# Patient Record
Sex: Male | Born: 1935
Health system: Southern US, Community
[De-identification: ages and names within clinical notes are randomized; demographics above are authoritative.]

## PROBLEM LIST (undated history)

## (undated) DIAGNOSIS — C61 Malignant neoplasm of prostate: Secondary | ICD-10-CM

## (undated) DIAGNOSIS — F32A Depression, unspecified: Secondary | ICD-10-CM

## (undated) DIAGNOSIS — M1990 Unspecified osteoarthritis, unspecified site: Secondary | ICD-10-CM

## (undated) DIAGNOSIS — F419 Anxiety disorder, unspecified: Secondary | ICD-10-CM

## (undated) DIAGNOSIS — R011 Cardiac murmur, unspecified: Secondary | ICD-10-CM

## (undated) DIAGNOSIS — C801 Malignant (primary) neoplasm, unspecified: Secondary | ICD-10-CM

## (undated) DIAGNOSIS — J189 Pneumonia, unspecified organism: Secondary | ICD-10-CM

## (undated) HISTORY — DX: Malignant (primary) neoplasm, unspecified: C80.1

## (undated) HISTORY — PX: WRIST SURGERY: SHX841

## (undated) HISTORY — DX: Unspecified osteoarthritis, unspecified site: M19.90

## (undated) HISTORY — DX: Cardiac murmur, unspecified: R01.1

## (undated) HISTORY — PX: OTHER SURGICAL HISTORY: SHX169

## (undated) HISTORY — PX: CHOLECYSTECTOMY: SHX55

---
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
## 1993-04-07 HISTORY — PX: MENISCUS REPAIR: SHX5179

## 2003-04-08 HISTORY — PX: INSERTION PROSTATE RADIATION SEED: SUR718

## 2003-08-15 ENCOUNTER — Ambulatory Visit: Admission: RE | Admit: 2003-08-15 | Discharge: 2003-09-11 | Payer: Self-pay | Admitting: Radiation Oncology

## 2003-11-03 ENCOUNTER — Ambulatory Visit: Admission: RE | Admit: 2003-11-03 | Discharge: 2004-02-01 | Payer: Self-pay | Admitting: Radiation Oncology

## 2004-02-08 ENCOUNTER — Ambulatory Visit: Admission: RE | Admit: 2004-02-08 | Discharge: 2004-02-08 | Payer: Self-pay | Admitting: Radiation Oncology

## 2004-06-06 ENCOUNTER — Observation Stay (HOSPITAL_COMMUNITY): Admission: RE | Admit: 2004-06-06 | Discharge: 2004-06-07 | Payer: Self-pay | Admitting: Surgery

## 2004-06-06 ENCOUNTER — Encounter (INDEPENDENT_AMBULATORY_CARE_PROVIDER_SITE_OTHER): Payer: Self-pay | Admitting: Specialist

## 2010-03-26 ENCOUNTER — Encounter
Admission: RE | Admit: 2010-03-26 | Discharge: 2010-03-26 | Payer: Self-pay | Source: Home / Self Care | Attending: Orthopaedic Surgery | Admitting: Orthopaedic Surgery

## 2010-04-28 ENCOUNTER — Encounter: Payer: Self-pay | Admitting: Gastroenterology

## 2010-04-29 ENCOUNTER — Encounter: Payer: Self-pay | Admitting: Gastroenterology

## 2010-08-23 NOTE — Op Note (Signed)
NAMERODERIC, LAMMERT                ACCOUNT NO.:  [id number]   MEDICAL RECORD NO.:  [id number]          PATIENT TYPE:  AMB   LOCATION:  DAY                          FACILITY:  [facility]   PHYSICIAN:  Currie Paris, M.D.DATE OF BIRTH:  [birth year]   DATE OF PROCEDURE:  06/06/2004  DATE OF DISCHARGE:                                 OPERATIVE REPORT   CCS NUMBER:  [id number].   PREOPERATIVE DIAGNOSIS:  Gallstones.   POSTOPERATIVE DIAGNOSIS:  Gallstones with possible common bile duct  stricture on intraoperative cholangiogram.   OPERATION:  Laparoscopic cholecystectomy with intraoperative cholangiogram.   SURGEON:  Currie Paris, M.D.   ANESTHESIA:  General.   CLINICAL HISTORY:  This patient is a 77 year old man noted to have  gallstones.  He began having some biliary-type symptoms.  Preoperative liver  functions were normal.   DESCRIPTION OF PROCEDURE:  Patient was seen in the holding area, and he had  no further questions.  He was taken to the operating room and after  satisfactory general endotracheal anesthesia had been obtained, the abdomen  was prepped and draped.  A time-out occurred.   I used a 0.25% plain Marcaine for each incision.  The umbilical incision was  made, and the fascia opened.  The peritoneal cavity entered under direct  vision.  A purse-string was placed, the Hasson introduced, and the abdomen  insufflated to 15.  The abdomen looked basically normal, when the camera was  placed.  The patient was placed in reverse Trendelenburg, and the 10-11  trocar placed in the epigastrium and the two 5's laterally.   With the gallbladder retracted over the liver, I opened the peritoneum and  the triangle of Calot, and identified both the cystic duct, which was fairly  long, and the cystic artery, and opened the window behind that, so I had  clear visualization of the tissues and the anatomy.   I put a single clip on the cystic artery and one on the cystic duct  at its  junction with the gallbladder.   The cystic duct was opened, and a Cook catheter introduced percutaneously.  I threaded into the cystic duct, and operative cholangiography done.  On the  first run, we saw dye in the duodenum, but it was very slow to enter and the  duct was somewhat plump.  I waited a few minutes and injected a little bit  more, and then we did a couple of more injections after giving some Glucagon  to try to dilate the distal duct and relax the sphincter.  I was concerned  there might be a small stricture at the distal duct, but there is no  evidence of stones, and the hepatic ducts all appear to be normal.  At this  point, the cystic duct catheter was removed, and three clips placed on the  stay side of the cystic duct, and it was divided.  The cystic artery was  dissected out a little further to make sure we had it well around, and clips  placed on it to divide it.   The gallbladder was  removed from below to above, and about a third of the  way up, there were some small vessels coming into the back of the  gallbladder directly out of the liver, which were clipped and divided.  Once  the gallbladder was removed, it was placed in a bag.  We spent several  minutes irrigating to make sure everything was dry, cauterizing the bed of  the liver.  I put a little piece of Surgicel in where the liver bed was a  little bit raw, and it remained dry while we were finishing the case and was  checked just prior to the completion of the case.   The camera was placed in the epigastric port, and the gallbladder removed at  the supraumbilical port.  We did a final irrigation check for hemostasis.  As noted, everything appeared to be dry.  The lateral ports were removed,  and there was no bleeding.  The purse string was used to close the umbilical  port.  The abdomen was deflated through the epigastric port.  Skin was  closed with 4-0 Monocryl and subcuticular with Dermabond.    Patient tolerated the procedure well.  There were no operative  complications.  All counts were correct.      CJS/MEDQ  D:  06/06/2004  T:  06/06/2004  Job:  [id number]   cc:   Gloriajean Dell. Andrey Campanile, M.D.  [id number]  [place]  [place] [address]  Fax: [phone]

## 2012-01-20 ENCOUNTER — Ambulatory Visit (INDEPENDENT_AMBULATORY_CARE_PROVIDER_SITE_OTHER): Payer: Medicare Other | Admitting: General Surgery

## 2012-01-20 ENCOUNTER — Encounter (INDEPENDENT_AMBULATORY_CARE_PROVIDER_SITE_OTHER): Payer: Self-pay | Admitting: General Surgery

## 2012-01-20 VITALS — BP 148/78 | HR 76 | Temp 97.7°F | Resp 16 | Ht 72.0 in | Wt 176.4 lb

## 2012-01-20 DIAGNOSIS — K4090 Unilateral inguinal hernia, without obstruction or gangrene, not specified as recurrent: Secondary | ICD-10-CM

## 2012-01-20 NOTE — Progress Notes (Signed)
Patient ID: Lee Oconnell, male   DOB: 1933/05/15, 78 y.o.   MRN: [id number]  Chief Complaint  Patient presents with  . Inguinal Hernia    new pt- eval LIH    HPI Lee Oconnell is a 78 y.o. male.  Self referred HPI This is a 78 year old male who has some anxiety. He has been treated for prostate cancer with radiation after having 2 TURPs in the past as well. He has a prior history of a right inguinal hernia repair at age 42 for which he had significant postoperative nausea and vomiting and had a very bad experience. He since has undergone a cholecystectomy for which he did very well though. He has some occasional right-sided abdominal pains with no notice of any bulge. He has no changes in his bowel movements and he states he's had a colonoscopy about 10 years ago that was fine. He has some occasional twinges in his right groin. This has been present for a long time. He also complains of some pain in his left groin as well as some tenderness when he coughs and a sensation that he feels something in that area. He describes this as an impulse. There is constantly a pinching an aching sensation. This comes and goes in terms of its severity. He does not related to any exercise or any activity except for coughing. He was diagnosed with a left groin hernia was then referred for evaluation.  Past Medical History  Diagnosis Date  . Arthritis   . Cancer   . Heart murmur     Past Surgical History  Procedure Date  . Appendectomy 1951  . Hernia repair 1951  . Tonsilectomy, adenoidectomy, bilateral myringotomy and tubes 1957  . Meniscus repair 1995  . Trans-urethral resection of prostate 1999, 2003  . Cholecystectomy     2006  . Insertion prostate radiation seed 2005    Family History  Problem Relation Age of Onset  . Heart disease Mother   . Stroke Father   . Cancer Paternal Grandfather     leukemia    Social History History  Substance Use Topics  . Smoking status: Never Smoker   .  Smokeless tobacco: Not on file  . Alcohol Use: Yes    No Known Allergies  Current Outpatient Prescriptions  Medication Sig Dispense Refill  . ALPRAZolam (XANAX) 0.25 MG tablet Take 0.25 mg by mouth at bedtime as needed.      . calcium carbonate (OS-CAL) 600 MG TABS Take 600 mg by mouth 2 (two) times daily with a meal.      . Cholecalciferol (VITAMIN D-3 PO) Take by mouth.      . cyanocobalamin 1000 MCG tablet Take 100 mcg by mouth daily.      . fish oil-omega-3 fatty acids 1000 MG capsule Take 2 g by mouth daily.      . mometasone (ELOCON) 0.1 % cream       . vitamin C (ASCORBIC ACID) 250 MG tablet Take 250 mg by mouth daily.        Review of Systems Review of Systems  Constitutional: Negative for fever, chills and unexpected weight change.  HENT: Negative for hearing loss, congestion, sore throat, trouble swallowing and voice change.   Eyes: Negative for visual disturbance.  Respiratory: Negative for cough and wheezing.   Cardiovascular: Negative for chest pain, palpitations and leg swelling.  Gastrointestinal: Positive for abdominal pain. Negative for nausea, vomiting, diarrhea, constipation, blood in stool, abdominal distention, anal bleeding and rectal  pain.  Genitourinary: Negative for hematuria and difficulty urinating.  Musculoskeletal: Positive for arthralgias.  Skin: Positive for rash. Negative for wound.  Neurological: Negative for seizures, syncope, weakness and headaches.  Hematological: Negative for adenopathy. Does not bruise/bleed easily.  Psychiatric/Behavioral: Negative for confusion.    Blood pressure 148/78, pulse 76, temperature 97.7 F (36.5 C), temperature source Temporal, resp. rate 16, height 6' (1.829 m), weight 176 lb 6.4 oz (80.015 kg).  Physical Exam Physical Exam  Vitals reviewed. Constitutional: He appears well-developed and well-nourished.  Cardiovascular: Normal rate, regular rhythm and normal heart sounds.   Pulmonary/Chest: Effort normal and  breath sounds normal. He has no wheezes. He has no rales.  Abdominal: Soft. Normal appearance and bowel sounds are normal. There is tenderness. A hernia is present. Hernia confirmed positive in the left inguinal area. Hernia confirmed negative in the right inguinal area.      Data Reviewed Prior notes  Assessment    LIH    Plan    I'm not sure what his right sided abdominal pain is coming from. There is no real source that I can identify my exam today. We discussed a possible CT scan. He first is going to see his gastroenterologist Dr. Matthias Hughs and I will follow up with him after that.  He does have a small left groin hernia. I'm not entirely sure that all of his symptoms are coming from the small hernia but certainly merits consideration for repair. I discussed that this may not relieve all of his symptoms in his left groin.    We discussed observation versus repair.  We discussed an open inguinal hernia repair. I described the procedure in detail.  The patient was given educational material.  Goals should be achieved with surgery. We discussed the usage of mesh and the rationale behind that. We went over the pathophysiology of inguinal hernia. We have elected to perform open inguinal hernia repair with mesh.  We discussed the risks including bleeding, infection, recurrence, postoperative pain and chronic groin pain, testicular injury, urinary retention, numbness in groin and around incision.      Sharisa Toves 01/20/2012, 11:29 AM

## 2012-02-05 ENCOUNTER — Other Ambulatory Visit: Payer: Self-pay | Admitting: Gastroenterology

## 2012-02-05 DIAGNOSIS — R1032 Left lower quadrant pain: Secondary | ICD-10-CM

## 2012-02-09 ENCOUNTER — Ambulatory Visit
Admission: RE | Admit: 2012-02-09 | Discharge: 2012-02-09 | Disposition: A | Discharge status: Home / Self Care | Payer: Medicare Other | Source: Ambulatory Visit | Attending: Gastroenterology | Admitting: Gastroenterology

## 2012-02-09 DIAGNOSIS — R1032 Left lower quadrant pain: Secondary | ICD-10-CM

## 2012-03-15 ENCOUNTER — Encounter (INDEPENDENT_AMBULATORY_CARE_PROVIDER_SITE_OTHER): Payer: Self-pay | Admitting: General Surgery

## 2012-03-15 ENCOUNTER — Ambulatory Visit (INDEPENDENT_AMBULATORY_CARE_PROVIDER_SITE_OTHER): Payer: Medicare Other | Admitting: General Surgery

## 2012-03-15 VITALS — BP 120/80 | HR 88 | Resp 18 | Ht 72.0 in | Wt 179.2 lb

## 2012-03-15 DIAGNOSIS — K4090 Unilateral inguinal hernia, without obstruction or gangrene, not specified as recurrent: Secondary | ICD-10-CM

## 2012-03-15 NOTE — Progress Notes (Signed)
Patient ID: Lee Oconnell, male   DOB: 04-Jan-1934, 78 y.o.   MRN: [id number]  No chief complaint on file.   HPI Lee Oconnell is a 78 y.o. male.   HPI This is a 78 year old male who I saw recently for a left inguinal hernia. Please see his prior no first history. Since I seen him he is been seen by gastroenterology. He underwent a CT scan of his abdomen and pelvis for a variety of pains that she was really only the left groin hernia. He returns today with no significant changes. Comes in today to discuss a left inguinal hernia repair.  Past Medical History  Diagnosis Date  . Arthritis   . Cancer   . Heart murmur     Past Surgical History  Procedure Date  . Appendectomy 1951  . Hernia repair 1951  . Tonsilectomy, adenoidectomy, bilateral myringotomy and tubes 1957  . Meniscus repair 1995  . Trans-urethral resection of prostate 1999, 2003  . Cholecystectomy     2006  . Insertion prostate radiation seed 2005    Family History  Problem Relation Age of Onset  . Heart disease Mother   . Stroke Father   . Cancer Paternal Grandfather     leukemia    Social History History  Substance Use Topics  . Smoking status: Never Smoker   . Smokeless tobacco: Not on file  . Alcohol Use: Yes    No Known Allergies  Current Outpatient Prescriptions  Medication Sig Dispense Refill  . ALPRAZolam (XANAX) 0.25 MG tablet Take 0.25 mg by mouth at bedtime as needed.      . calcium carbonate (OS-CAL) 600 MG TABS Take 600 mg by mouth 2 (two) times daily with a meal.      . Cholecalciferol (VITAMIN D-3 PO) Take by mouth.      . cyanocobalamin 1000 MCG tablet Take 100 mcg by mouth daily.      . fish oil-omega-3 fatty acids 1000 MG capsule Take 2 g by mouth daily.      . mometasone (ELOCON) 0.1 % cream       . vitamin C (ASCORBIC ACID) 250 MG tablet Take 250 mg by mouth daily.        Review of Systems Review of Systems  There were no vitals taken for this visit.  Physical Exam  Physical  Exam  Vitals reviewed. Constitutional: He appears well-developed and well-nourished.  Cardiovascular: Normal rate, regular rhythm and normal heart sounds.   Pulmonary/Chest: Effort normal and breath sounds normal. He has no wheezes. He has no rales.  Abdominal: Soft. There is no tenderness. A hernia is present. Hernia confirmed positive in the left inguinal area.    Data Reviewed CT ABDOMEN AND PELVIS WITHOUT CONTRAST  Technique: Multidetector CT imaging of the abdomen and pelvis was  performed following the standard protocol without intravenous  contrast.  Comparison: CT scan dated 05/04/2006  Findings: The patient has a small left inguinal hernia containing  only fat. This is more prominent than on the prior exam but there  is only a tiny amount of fat in the inguinal canal which can be  seen as a normal finding.  Again noted is a prominent varix in the left lobe of the liver as  previously evaluated on 05/04/2006. Liver parenchyma is otherwise  normal. Gallbladder has been removed.  Spleen and pancreas and adrenal glands are normal. Multiple left  renal cysts have enlarged. Right kidney is normal.  The bowel appears normal. There  is a large posterior bladder  diverticulum, essentially unchanged. Prostate gland is enlarged  but diminished in size since the prior study. Multiple small  metallic clips are seen in the prostate gland.  The patient has severe facet arthritis in the lower lumbar spine  with grade 1 spondylolisthesis of L4 and L5. Both sacroiliac  joints are partially fused.  IMPRESSION:  1. Small left inguinal hernia containing only fat.  2. Extensive degenerative facet arthritis in the lower lumbar  spine.  3. Stable large bladder diverticulum.  4. Stable prominent varix in the left lobe of the liver.   Assessment    LIH    Plan    We discussed observation versus repair.  We discussed  open inguinal hernia repair. I described the procedure in detail.  The  patient was given educational material.  Goals should be achieved with surgery. We discussed the usage of mesh and the rationale behind that. We went over the pathophysiology of inguinal hernia. We have elected to perform open inguinal hernia repair with mesh.  We discussed the risks including bleeding, infection, recurrence, postoperative pain and chronic groin pain, testicular injury, urinary retention, numbness in groin and around incision.         Teruko Joswick 03/15/2012, 1:31 PM

## 2012-03-18 ENCOUNTER — Telehealth (INDEPENDENT_AMBULATORY_CARE_PROVIDER_SITE_OTHER): Payer: Self-pay | Admitting: General Surgery

## 2012-03-18 NOTE — Telephone Encounter (Signed)
Pt using topical NSAID for "knee problem."  Advised pt to stop using it on Saturday, for surgery on the following Thursday.  He understands and will comply.

## 2012-03-25 ENCOUNTER — Telehealth (INDEPENDENT_AMBULATORY_CARE_PROVIDER_SITE_OTHER): Payer: Self-pay

## 2012-03-25 DIAGNOSIS — K4090 Unilateral inguinal hernia, without obstruction or gangrene, not specified as recurrent: Secondary | ICD-10-CM

## 2012-03-25 NOTE — Telephone Encounter (Signed)
Pt called with questions about dosing of his pain medication - he is having a lot of pain.  He was advised to take 2 Percocet every 4-6 hours if needed the first 48 hours and not try to "wean" off for the next several days.

## 2012-03-26 ENCOUNTER — Telehealth (INDEPENDENT_AMBULATORY_CARE_PROVIDER_SITE_OTHER): Payer: Self-pay | Admitting: General Surgery

## 2012-03-26 NOTE — Telephone Encounter (Signed)
Pt called to report feeling really strange and ?halluncinations since taking the Roxicet about an hour ago.  Recommended he not take any more of them.  He is home with his wife, who will keep a close eye on him for the next several hours while the med metabolizes out of his system.  Offered to call in a different pain med, but he wants to try using only Tylenol or Ibuprofen for now.  Also discussed using ice pack and elevation of genitals for swelling.

## 2012-03-29 ENCOUNTER — Telehealth (INDEPENDENT_AMBULATORY_CARE_PROVIDER_SITE_OTHER): Payer: Self-pay

## 2012-03-29 ENCOUNTER — Telehealth (INDEPENDENT_AMBULATORY_CARE_PROVIDER_SITE_OTHER): Payer: Self-pay | Admitting: General Surgery

## 2012-03-29 NOTE — Telephone Encounter (Signed)
Discomfort has now localized to the top of the incision.  Top of incision is tender and sore.  No redness, no fever, rates pain a 4 on a 0-10 scale.   Patient also c/o scrotal edema.  Advised patient to elevate scrotum and use ice.  Please advise.

## 2012-03-29 NOTE — Telephone Encounter (Signed)
Message copied by Littie Deeds on Mon Mar 29, 2012  2:06 PM ------      Message from: Delora Fuel      Created: Mon Mar 29, 2012  2:03 PM      Regarding: Lee Oconnell      Contact: [phone]       Pt called is having pain and swelling at top of incision, it is not red and there is no fever. He is having discomfort due to this, please advise him best thing to do.            Thanks,      Leotis Shames

## 2012-03-29 NOTE — Telephone Encounter (Signed)
Spoke with pt and informed him to try ibuprofen around the clock as well as a heating pad/ ice depending on which one helps alleviate the pain.  Informed him to call back if things seem to get worse.

## 2012-04-08 ENCOUNTER — Telehealth (INDEPENDENT_AMBULATORY_CARE_PROVIDER_SITE_OTHER): Payer: Self-pay | Admitting: General Surgery

## 2012-04-08 NOTE — Telephone Encounter (Signed)
Pt called to ask about new pain at surgical site, describing "burning" or "tearing" there.  Reassured pt that this in not unexpected and is a part of the scar tissue and mesh attachment process.  Advised ibuprofen and ice pack.

## 2012-04-09 ENCOUNTER — Telehealth (INDEPENDENT_AMBULATORY_CARE_PROVIDER_SITE_OTHER): Payer: Self-pay | Admitting: General Surgery

## 2012-04-09 NOTE — Telephone Encounter (Signed)
Opened in error/bp

## 2012-04-09 NOTE — Telephone Encounter (Signed)
Pt called to report his ankles are swelling.  They are fine when he wakes in the morning, after they have been elevated all night.  But as the day progresses, they swell.  He denies pain or fever.  He can produce pitting when instructed to press into the area with his finger.  His son-in-law is a PA and recommended he call his PCP, but he PCP deferred to his Careers adviser.  No other problems to report at this time.

## 2012-04-10 NOTE — Telephone Encounter (Signed)
This is likely not related to his surgery as much as it is regular medical issue.  Bilateral swelling after being up all day should not be a dvt.  I think this is absolutely best managed by his pcp but I would be happy to see him next week sometime.

## 2012-04-12 NOTE — Telephone Encounter (Signed)
Called pt to check on him; he is now wearing TED stockings with good control of the swelling.  Conveyed Dr. Doreen Salvage message to see his PCP, that is not result of his surgery.  He agrees and will call his PCP back.

## 2012-04-14 ENCOUNTER — Ambulatory Visit (INDEPENDENT_AMBULATORY_CARE_PROVIDER_SITE_OTHER): Payer: Medicare Other | Admitting: General Surgery

## 2012-04-14 ENCOUNTER — Encounter (INDEPENDENT_AMBULATORY_CARE_PROVIDER_SITE_OTHER): Payer: Self-pay | Admitting: General Surgery

## 2012-04-14 VITALS — BP 140/82 | HR 86 | Temp 97.0°F | Ht 72.0 in | Wt 179.8 lb

## 2012-04-14 DIAGNOSIS — Z09 Encounter for follow-up examination after completed treatment for conditions other than malignant neoplasm: Secondary | ICD-10-CM

## 2012-04-14 NOTE — Progress Notes (Signed)
Subjective:     Patient ID: Lee Oconnell, male   DOB: February 15, 1934, 79 y.o.   MRN: [id number]  HPI 37 yom s/p LIH repair 3 weeks ago who has some pain in his left groin.  He states he has some numbness and swelling which is improving.  He had some ankle swelling that is getting better.  He is concerned that it is still sore today.  Review of Systems     Objective:   Physical Exam Healing left groin incision without infection, minimal edema    Assessment:     S/p LIH repair    Plan:     I think he is doing just fine. I think his pain is in proportion with where it would be right now. He is very concerned about a number of things I reassured him today that all of these are normal postoperatively and should resolve with some time. I will plan on seeing him back in one month.

## 2012-04-16 ENCOUNTER — Encounter (INDEPENDENT_AMBULATORY_CARE_PROVIDER_SITE_OTHER): Payer: Medicare Other | Admitting: General Surgery

## 2012-05-13 ENCOUNTER — Encounter (INDEPENDENT_AMBULATORY_CARE_PROVIDER_SITE_OTHER): Payer: Medicare Other | Admitting: General Surgery

## 2012-05-14 ENCOUNTER — Ambulatory Visit (INDEPENDENT_AMBULATORY_CARE_PROVIDER_SITE_OTHER): Payer: Medicare Other | Admitting: General Surgery

## 2012-05-14 ENCOUNTER — Encounter (INDEPENDENT_AMBULATORY_CARE_PROVIDER_SITE_OTHER): Payer: Self-pay | Admitting: General Surgery

## 2012-05-14 VITALS — BP 132/80 | HR 72 | Resp 18 | Ht 72.0 in | Wt 181.0 lb

## 2012-05-14 DIAGNOSIS — Z09 Encounter for follow-up examination after completed treatment for conditions other than malignant neoplasm: Secondary | ICD-10-CM

## 2012-05-14 NOTE — Progress Notes (Signed)
Subjective:     Patient ID: Lee Oconnell, male   DOB: May 08, 1933, 79 y.o.   MRN: [id number]  HPI This is a 79 year old s/p LIH with mesh about 8 weeks ago. He is very anxious.  He is slowly improving and he has some very minimal swelling and he has some discomfort as he has started to increase his activity.  He comes back in today to discuss his progress.   Review of Systems     Objective:   Physical Exam Left groin incision healing well, there is mild tenderness, minimal swelling, no hernia    Assessment:     S/p lih repair    Plan:     I think he is doing just fine. I told him he could start increasing his activity morning and it is normal to have a little bit of discomfort. I think everything he has is completely within the realm of the normal postoperative course but he is very anxious about it. I assured him that this will continue to get better. I will plan on seeing him back in 6 weeks to make sure he is doing well.

## 2012-06-23 ENCOUNTER — Encounter (INDEPENDENT_AMBULATORY_CARE_PROVIDER_SITE_OTHER): Payer: Self-pay | Admitting: General Surgery

## 2012-06-23 ENCOUNTER — Ambulatory Visit (INDEPENDENT_AMBULATORY_CARE_PROVIDER_SITE_OTHER): Payer: Medicare Other | Admitting: General Surgery

## 2012-06-23 VITALS — BP 119/72 | HR 72 | Temp 98.6°F | Resp 12 | Ht 72.0 in | Wt 184.6 lb

## 2012-06-23 DIAGNOSIS — Z09 Encounter for follow-up examination after completed treatment for conditions other than malignant neoplasm: Secondary | ICD-10-CM

## 2012-06-23 NOTE — Progress Notes (Signed)
Subjective:     Patient ID: Lee Oconnell, male   DOB: [birth year], 79 y.o.   MRN: [id number]  HPI 52 yom with history of anxiety who is now 13 weeks s/p lih repair.  He has done well from this  He has returned to most of his normal activity.  He now complains of some pain and tightness after doing activity.  He is eating well and really has no other complaints.  Review of Systems     Objective:   Physical Exam Some minimal scarring, incision well healed, no hernia    Assessment:     S/p LIH    Plan:     I think he has some muscular symptoms and we discussed heat, nsaids.  We also discussed stretching which he does not do at all.  I will refer him to physical therapy to see if they can help.  I will see back in 2 months if he needs to.

## 2012-06-25 ENCOUNTER — Encounter (INDEPENDENT_AMBULATORY_CARE_PROVIDER_SITE_OTHER): Payer: Medicare Other | Admitting: General Surgery

## 2012-06-28 ENCOUNTER — Ambulatory Visit: Payer: Medicare Other | Attending: General Surgery | Admitting: Physical Therapy

## 2012-06-28 DIAGNOSIS — R5381 Other malaise: Secondary | ICD-10-CM | POA: Insufficient documentation

## 2012-06-28 DIAGNOSIS — IMO0001 Reserved for inherently not codable concepts without codable children: Secondary | ICD-10-CM | POA: Insufficient documentation

## 2012-06-28 DIAGNOSIS — M2420 Disorder of ligament, unspecified site: Secondary | ICD-10-CM | POA: Insufficient documentation

## 2012-06-28 DIAGNOSIS — M629 Disorder of muscle, unspecified: Secondary | ICD-10-CM | POA: Insufficient documentation

## 2012-07-05 ENCOUNTER — Ambulatory Visit: Payer: Medicare Other | Admitting: Physical Therapy

## 2012-07-08 ENCOUNTER — Ambulatory Visit: Payer: Medicare Other | Attending: General Surgery | Admitting: Physical Therapy

## 2012-07-08 DIAGNOSIS — M6200 Separation of muscle (nontraumatic), unspecified site: Secondary | ICD-10-CM | POA: Insufficient documentation

## 2012-07-08 DIAGNOSIS — R1032 Left lower quadrant pain: Secondary | ICD-10-CM | POA: Insufficient documentation

## 2012-07-08 DIAGNOSIS — R5381 Other malaise: Secondary | ICD-10-CM | POA: Insufficient documentation

## 2012-07-08 DIAGNOSIS — IMO0001 Reserved for inherently not codable concepts without codable children: Secondary | ICD-10-CM | POA: Insufficient documentation

## 2012-07-12 ENCOUNTER — Encounter: Payer: Medicare Other | Admitting: Physical Therapy

## 2012-07-15 ENCOUNTER — Ambulatory Visit: Payer: Medicare Other | Admitting: Physical Therapy

## 2012-07-20 ENCOUNTER — Telehealth (INDEPENDENT_AMBULATORY_CARE_PROVIDER_SITE_OTHER): Payer: Self-pay

## 2012-07-20 NOTE — Telephone Encounter (Signed)
Pt calling c/o discomfort and pain.  He explained that it comes and goes, but is very uncomfortable.  He says stretching exercises have not seemed to help at all.  He does not take any NSAIDS.  He states, "he does not take pain medicines".  I explained they were for inflammation only.  He is not applying warm compresses either.  Will pass this message on to Dr. Doreen Salvage nurse to see if he can be seen sooner.

## 2012-07-20 NOTE — Telephone Encounter (Signed)
I called Lee Oconnell back after his message to talk about his discomfort that he was having since Dr Dwain Sarna not in the office this week. The Lee Oconnell stated that he has been having a pulling sensation that comes and goes it not's related to any certain activity. The Lee Oconnell has not been doing anything for the discomfort. I advised for him to take an antinflammatory for a couple of weeks and to use a warm heating pad at night. I advised Lee Oconnell that Dr Dwain Sarna is not in the office so that is why his appt is for 5/12. The Lee Oconnell understands and will try the advice I gave him.

## 2012-08-16 ENCOUNTER — Ambulatory Visit (INDEPENDENT_AMBULATORY_CARE_PROVIDER_SITE_OTHER): Payer: Medicare Other | Admitting: General Surgery

## 2012-08-16 ENCOUNTER — Encounter (INDEPENDENT_AMBULATORY_CARE_PROVIDER_SITE_OTHER): Payer: Self-pay | Admitting: General Surgery

## 2012-08-16 VITALS — BP 138/72 | HR 76 | Temp 97.0°F | Ht 72.0 in | Wt 183.0 lb

## 2012-08-16 DIAGNOSIS — R1032 Left lower quadrant pain: Secondary | ICD-10-CM

## 2012-08-16 NOTE — Progress Notes (Signed)
Subjective:     Patient ID: Lee Oconnell, male   DOB: 1933-07-02, 79 y.o.   MRN: [id number]  HPI  This is a 79 year old male who I did a left inguinal hernia repair with mesh on in December. He had some groin pain with some pinching and tightness postoperatively but the groin pain is really all resolved now. I did send to see physical therapy and the groin pain has gotten much better. He reports now that he's had increasing pain and a pinching sensation that really is left lower quadrant. He has drawn this area out by himself today and this is really in his left lower quadrant of his abdomen. He describes as a pinching or tightening sensation that is mostly occurs when he is up moving around. This is when he notices it primarily. This area also radiates around his side and his flank. He has no urinary symptoms. He has no changes in his bowel movements. He has not gotten a colonoscopy 11 years and is due to see gastroenterology next week for that.  Review of Systems     Objective:   Physical Exam  Abdominal: Hernia confirmed negative in the left inguinal area.         Assessment:     LLQ pain    Plan:     This certainly got worse after his hernia repair. This is not where his hernia was repaired and I don't even think that it is postop hernia pain at this point. I'm concerned that he may have something else going on as a source of this now. Although it does sound like this may be muscular in nature. He has not had a colonoscopy 11 years. And I think that is reasonable as he will see Dr. Matthias Hughs next week. I will also send him to get ct now to ensure hernia repair is intact and also to rule out low grade diverticular disease.  If these end up being negative he may need back reevaluated also.  We will call with ct results and then determine follow up

## 2012-08-16 NOTE — Addendum Note (Signed)
Addended by: Ethlyn Gallery on: 08/16/2012 09:38 AM   Modules accepted: Orders

## 2012-08-17 ENCOUNTER — Inpatient Hospital Stay: Admission: RE | Admit: 2012-08-17 | Payer: Medicare Other | Source: Ambulatory Visit

## 2012-08-18 ENCOUNTER — Ambulatory Visit
Admission: RE | Admit: 2012-08-18 | Discharge: 2012-08-18 | Disposition: A | Discharge status: Home / Self Care | Payer: Medicare Other | Source: Ambulatory Visit | Attending: General Surgery | Admitting: General Surgery

## 2012-08-18 DIAGNOSIS — R1032 Left lower quadrant pain: Secondary | ICD-10-CM

## 2012-08-18 MED ORDER — IOHEXOL 300 MG/ML  SOLN
100.0000 mL | Freq: Once | INTRAMUSCULAR | Status: AC | PRN
  Administered 2012-08-18: 100 mL via INTRAVENOUS

## 2012-08-23 ENCOUNTER — Telehealth (INDEPENDENT_AMBULATORY_CARE_PROVIDER_SITE_OTHER): Payer: Self-pay

## 2012-08-23 NOTE — Telephone Encounter (Signed)
Called pt to notify him that his CT scan does not show anything with his hernia or left lower quadrant pain per Dr Dwain Sarna. The scan did show some thickening of anorectal junction that will need to be scoped by Dr Clent Ridges. The pt has an appt tomorrow with Dr Matthias Hughs so I will fax the CT results along with Dr Doreen Salvage office note to [phone]. The pt understands.

## 2012-09-07 ENCOUNTER — Other Ambulatory Visit: Payer: Self-pay | Admitting: Rehabilitation

## 2012-09-07 DIAGNOSIS — M4310 Spondylolisthesis, site unspecified: Secondary | ICD-10-CM

## 2012-09-07 DIAGNOSIS — M545 Low back pain, unspecified: Secondary | ICD-10-CM

## 2012-09-12 ENCOUNTER — Ambulatory Visit
Admission: RE | Admit: 2012-09-12 | Discharge: 2012-09-12 | Disposition: A | Discharge status: Home / Self Care | Payer: Medicare Other | Source: Ambulatory Visit | Attending: Rehabilitation | Admitting: Rehabilitation

## 2012-09-12 DIAGNOSIS — M4310 Spondylolisthesis, site unspecified: Secondary | ICD-10-CM

## 2012-09-12 DIAGNOSIS — M545 Low back pain, unspecified: Secondary | ICD-10-CM

## 2012-11-08 ENCOUNTER — Other Ambulatory Visit: Payer: Self-pay | Admitting: Gastroenterology

## 2013-07-25 ENCOUNTER — Telehealth (INDEPENDENT_AMBULATORY_CARE_PROVIDER_SITE_OTHER): Payer: Self-pay

## 2013-07-25 NOTE — Telephone Encounter (Signed)
Returned pt's call. The pt is requesting for Dr Donne Hazel to review his last CT A/P scan that he had done in 2014. The pt was told by his urologist the other day that the diastasis recti is really thin and the pt should do exercises to help strengthen these muscles. The pt was always told that nothing could really be done about the diastasis recti and he really just wants your opinion about the muscles. The pt doesn't want to do the exercises if it will not really change the outcome of the diastasis recti. The pt thinks the urologist doesn't know that much about the diastasis recti so he wanted to check with you. I explained to the pt that Dr Donne Hazel was out of the office for a week and it would be some time before I got back with him on the answer. The pt understands.

## 2013-08-19 NOTE — Telephone Encounter (Signed)
More ab work will not really change the diastasis

## 2013-08-19 NOTE — Telephone Encounter (Signed)
LMOM that Dr Donne Hazel advises that the ab work exercises will not really change the diastasis outcome. If any more questions to please call.

## 2013-12-26 ENCOUNTER — Encounter (INDEPENDENT_AMBULATORY_CARE_PROVIDER_SITE_OTHER): Payer: Medicare Other | Admitting: General Surgery

## 2015-03-07 ENCOUNTER — Ambulatory Visit: Payer: Medicare Other | Admitting: Podiatry

## 2015-03-08 ENCOUNTER — Ambulatory Visit (INDEPENDENT_AMBULATORY_CARE_PROVIDER_SITE_OTHER): Payer: Medicare Other | Admitting: Podiatry

## 2015-03-08 ENCOUNTER — Encounter: Payer: Self-pay | Admitting: Podiatry

## 2015-03-08 VITALS — BP 146/75 | HR 78 | Resp 14

## 2015-03-08 DIAGNOSIS — M79673 Pain in unspecified foot: Secondary | ICD-10-CM

## 2015-03-08 DIAGNOSIS — B351 Tinea unguium: Secondary | ICD-10-CM | POA: Diagnosis not present

## 2015-03-08 DIAGNOSIS — M79676 Pain in unspecified toe(s): Secondary | ICD-10-CM

## 2015-03-08 DIAGNOSIS — L84 Corns and callosities: Secondary | ICD-10-CM

## 2015-03-08 NOTE — Progress Notes (Signed)
   Subjective:    Patient ID: Lee Oconnell, male    DOB: [birth year], 81 y.o.   MRN: [id number]  HPI this patient returns to the office with a: Developing on his fifth toe left foot. He states he also has painful thick nails noted. The big toes of both feet and all the toes on his right foot. He states that the corn as well as the nails are painful as he walks and wears his shoes. He presents the office for preventive foot care services The patient presents here in our office today with a spot on top of the right great toe and it has been there for several years and seems to be getting worse, also callous on the ball of the right foot. He has a left 5th toe that is sore since 2 wks long.   Review of Systems  All other systems reviewed and are negative.      Objective:   Physical Exam GENERAL APPEARANCE: Alert, conversant. Appropriately groomed. No acute distress.  VASCULAR: Pedal pulses palpable at  [facility] and PT bilateral.  Capillary refill time is immediate to all digits,  Normal temperature gradient.  Digital hair growth is present bilateral  NEUROLOGIC: sensation is normal to 5.07 monofilament at 5/5 sites bilateral.  Light touch is intact bilateral, Muscle strength normal.  MUSCULOSKELETAL: acceptable muscle strength, tone and stability bilateral.  Intrinsic muscluature intact bilateral.  Rectus appearance of foot and digits noted bilateral.  Contracture IPJ Hallux B/L  DERMATOLOGIC: skin color, texture, and turgor are within normal limits.  No preulcerative lesions or ulcers  are seen, no interdigital maceration noted.  No open lesions present.  Heloma durum fofth toe left foot.Marland Kitchen  NAILS  Thick disfigured discolored nails hallux B/L and 2-5 right foot.                       Assessment & Plan:  Onychomycosis  Heloma Durum fifth toe left foot.  Deride Nails  Debride Corn  Padding fifth toe.  RTC prn  Gardiner Barefoot DPM

## 2015-05-03 ENCOUNTER — Ambulatory Visit (INDEPENDENT_AMBULATORY_CARE_PROVIDER_SITE_OTHER): Payer: Medicare Other | Admitting: Podiatry

## 2015-05-03 ENCOUNTER — Encounter: Payer: Self-pay | Admitting: Podiatry

## 2015-05-03 VITALS — BP 128/74 | HR 74 | Resp 14

## 2015-05-03 DIAGNOSIS — M79676 Pain in unspecified toe(s): Secondary | ICD-10-CM

## 2015-05-03 DIAGNOSIS — B351 Tinea unguium: Secondary | ICD-10-CM

## 2015-05-03 NOTE — Progress Notes (Signed)
Subjective:     Patient ID: Lee Oconnell, male   DOB: 23-Jan-1934, 82 y.o.   MRN: [id number]  HPI this patient presents the office to discuss his thick mycotic nails on his right foot. He had previously been dispensed topical medicine in an effort to help to eliminate the fungus toenails. He says that the toenails do not appear to be any worse, but they are definitely not improving. He presents the office for an opinion whether he should continue the medication since it has run out   Review of Systems     Objective:   Physical Exam     Physical Exam GENERAL APPEARANCE: Alert, conversant. Appropriately groomed. No acute distress.  VASCULAR: Pedal pulses palpable at [facility] and PT bilateral. Capillary refill time is immediate to all digits, Normal temperature gradient. Digital hair growth is present bilateral  NEUROLOGIC: sensation is normal to 5.07 monofilament at 5/5 sites bilateral. Light touch is intact bilateral, Muscle strength normal.  MUSCULOSKELETAL: acceptable muscle strength, tone and stability bilateral. Intrinsic muscluature intact bilateral. Rectus appearance of foot and digits noted bilateral. Contracture IPJ Hallux B/L  DERMATOLOGIC: skin color, texture, and turgor are within normal limits. No preulcerative lesions or ulcers are seen, no interdigital maceration noted. No open lesions present. Heloma durum fofth toe left foot.Marland Kitchen  NAILS Thick disfigured discolored nails hallux B/L and 2-5 right foot            Assessment:     Onychomycosis  Right fiit    Plan:     ROV  Discussed his nails with patient and agreed that there is minimal improvement.  He was told to d/c fungal solution and return to my office every 10-12 weeks.      Gardiner Barefoot DPM

## 2015-08-30 ENCOUNTER — Ambulatory Visit (INDEPENDENT_AMBULATORY_CARE_PROVIDER_SITE_OTHER): Payer: Medicare Other | Admitting: Podiatry

## 2015-08-30 ENCOUNTER — Encounter: Payer: Self-pay | Admitting: Podiatry

## 2015-08-30 DIAGNOSIS — Q828 Other specified congenital malformations of skin: Secondary | ICD-10-CM | POA: Diagnosis not present

## 2015-08-30 NOTE — Progress Notes (Signed)
Subjective:     Patient ID: Lee Oconnell, male   DOB: 05/03/1933, 82 y.o.   MRN: [id number]  HPI this patient returns to the office saying that he has developed a callus  under his right forefoot.  He says that this callus has developed in the last few months. He says his painful and sore walking and wearing his shoes. He is concerned that it developed despite the fact that he is wearing his orthotics. He presents the office today for continued evaluation and treatment of this condition   Review of Systems     Objective:   Physical Exam GENERAL APPEARANCE: Alert, conversant. Appropriately groomed. No acute distress.  VASCULAR: Pedal pulses are  palpable at  [facility] and PT bilateral.  Capillary refill time is immediate to all digits,  Normal temperature gradient.  Digital hair growth is present bilateral  NEUROLOGIC: sensation is normal to 5.07 monofilament at 5/5 sites bilateral.  Light touch is intact bilateral, Muscle strength normal.  MUSCULOSKELETAL: acceptable muscle strength, tone and stability bilateral.  Intrinsic muscluature intact bilateral.  Rectus appearance of foot and digits noted bilateral. Contracture IPJ  B/L  DERMATOLOGIC: skin color, texture, and turgor are within normal limits.  No preulcerative lesions or ulcers  are seen, no interdigital maceration noted.  No open lesions present.  Digital nails are asymptomatic. No drainage noted. Porokeratosis sub2 right foot.     Assessment:     Porokeratosis right forefoot     Plan:     Debridement of porokeratosis. RTC prn   Gardiner Barefoot DPM

## 2015-09-19 ENCOUNTER — Ambulatory Visit (INDEPENDENT_AMBULATORY_CARE_PROVIDER_SITE_OTHER): Payer: Medicare Other | Admitting: Podiatry

## 2015-09-19 ENCOUNTER — Encounter: Payer: Self-pay | Admitting: Podiatry

## 2015-09-19 DIAGNOSIS — Q828 Other specified congenital malformations of skin: Secondary | ICD-10-CM

## 2015-09-19 NOTE — Progress Notes (Signed)
Subjective:     Patient ID: Lee Oconnell, male   DOB: 1933/04/28, 82 y.o.   MRN: [id number]  HPI this patient returns to the office stating that he has developed a new callus on his right forefoot. He states this is a new callus that has formed and that the callus that I worked on last visit is still pain-free. He presents the office today for continued evaluation and treatment of his feet   Review of Systems     Objective:   Physical Exam GENERAL APPEARANCE: Alert, conversant. Appropriately groomed. No acute distress.  VASCULAR: Pedal pulses are  palpable at  [facility] and PT bilateral.  Capillary refill time is immediate to all digits,  Normal temperature gradient.  Digital hair growth is present bilateral  NEUROLOGIC: sensation is normal to 5.07 monofilament at 5/5 sites bilateral.  Light touch is intact bilateral, Muscle strength normal.  MUSCULOSKELETAL: acceptable muscle strength, tone and stability bilateral.  Intrinsic muscluature intact bilateral.  Rectus appearance of foot and digits noted bilateral. Contracture IPJ  B/L.  DERMATOLOGIC: skin color, texture, and turgor are within normal limits.  No preulcerative lesions or ulcers  are seen, no interdigital maceration noted.  No open lesions present.  Digital nails are asymptomatic. No drainage noted. Porokeratosis sub 3 right forefoot.      Assessment:  Porokeratosis right foot     Plan:     Debridement of porokeratosis right.  RTC prn     Gardiner Barefoot DPM

## 2015-11-22 ENCOUNTER — Encounter: Payer: Self-pay | Admitting: Podiatry

## 2015-11-22 ENCOUNTER — Other Ambulatory Visit: Payer: Self-pay | Admitting: Rehabilitation

## 2015-11-22 ENCOUNTER — Ambulatory Visit (INDEPENDENT_AMBULATORY_CARE_PROVIDER_SITE_OTHER): Payer: Medicare Other | Admitting: Podiatry

## 2015-11-22 DIAGNOSIS — M79676 Pain in unspecified toe(s): Secondary | ICD-10-CM

## 2015-11-22 DIAGNOSIS — Q828 Other specified congenital malformations of skin: Secondary | ICD-10-CM

## 2015-11-22 DIAGNOSIS — B351 Tinea unguium: Secondary | ICD-10-CM | POA: Diagnosis not present

## 2015-11-22 DIAGNOSIS — M542 Cervicalgia: Secondary | ICD-10-CM

## 2015-11-22 NOTE — Progress Notes (Signed)
Subjective:     Patient ID: Lee Oconnell, male   DOB: Sep 08, 1933, 82 y.o.   MRN: [id number]  HPI this patient returns to the office stating that he has developed a  callus on his right forefoot. He states the callus on right forefoot is painful walking and wearing his shoes.  He says he may need a new pair of orthoses. He presents the office today for continued evaluation and treatment of his feet   Review of Systems     Objective:   Physical Exam GENERAL APPEARANCE: Alert, conversant. Appropriately groomed. No acute distress.  VASCULAR: Pedal pulses are  palpable at  [facility] and PT bilateral.  Capillary refill time is immediate to all digits,  Normal temperature gradient.  Digital hair growth is present bilateral  NEUROLOGIC: sensation is normal to 5.07 monofilament at 5/5 sites bilateral.  Light touch is intact bilateral, Muscle strength normal.  MUSCULOSKELETAL: acceptable muscle strength, tone and stability bilateral.  Intrinsic muscluature intact bilateral.  Rectus appearance of foot and digits noted bilateral. Contracture IPJ  B/L.  DERMATOLOGIC: skin color, texture, and turgor are within normal limits.  No preulcerative lesions or ulcers  are seen, no interdigital maceration noted.  No open lesions present.  . No drainage noted. Porokeratosis sub 3 right forefoot.  NAILS  Thick disfigured discolored nails 1-5 right and 1 left.      Assessment:  Porokeratosis right foot  Onychomycosis    Plan:     Debridement of porokeratosis right. Debridement of nails.  Adjusted his orthoses on right foot.    Gardiner Barefoot DPM

## 2015-12-06 ENCOUNTER — Ambulatory Visit
Admission: RE | Admit: 2015-12-06 | Discharge: 2015-12-06 | Disposition: A | Discharge status: Home / Self Care | Payer: Medicare Other | Source: Ambulatory Visit | Attending: Rehabilitation | Admitting: Rehabilitation

## 2015-12-06 DIAGNOSIS — M542 Cervicalgia: Secondary | ICD-10-CM

## 2016-01-11 ENCOUNTER — Ambulatory Visit (INDEPENDENT_AMBULATORY_CARE_PROVIDER_SITE_OTHER): Payer: Medicare Other | Admitting: *Deleted

## 2016-01-11 DIAGNOSIS — Q828 Other specified congenital malformations of skin: Secondary | ICD-10-CM

## 2016-01-11 NOTE — Progress Notes (Signed)
Patient ID: Lee Oconnell, male   DOB: 1933/05/07, 82 y.o.   MRN: [id number]   Patient presents at Dr Burnell Blanks request to be scanned for custom molded orthotics.

## 2016-02-01 ENCOUNTER — Ambulatory Visit (INDEPENDENT_AMBULATORY_CARE_PROVIDER_SITE_OTHER): Payer: Medicare Other

## 2016-02-01 DIAGNOSIS — Q828 Other specified congenital malformations of skin: Secondary | ICD-10-CM

## 2016-02-01 NOTE — Progress Notes (Signed)
Orthotics dispensed with verbal and written instructions. Pt is to follow up if symptoms worsen or fail to improve.

## 2016-02-01 NOTE — Patient Instructions (Signed)

## 2016-03-14 ENCOUNTER — Ambulatory Visit (INDEPENDENT_AMBULATORY_CARE_PROVIDER_SITE_OTHER): Payer: Medicare Other | Admitting: Podiatry

## 2016-03-14 ENCOUNTER — Encounter: Payer: Self-pay | Admitting: Podiatry

## 2016-03-14 VITALS — Resp 14 | Ht 72.0 in

## 2016-03-14 DIAGNOSIS — Q828 Other specified congenital malformations of skin: Secondary | ICD-10-CM

## 2016-03-14 DIAGNOSIS — L84 Corns and callosities: Secondary | ICD-10-CM | POA: Diagnosis not present

## 2016-03-14 NOTE — Progress Notes (Signed)
Subjective:     Patient ID: Lee Oconnell, male   DOB: [birth year], 82 y.o.   MRN: [id number]  Foot Pain    this patient returns to the office stating that he has developed a  callus on his right forefoot. He states the callus on right forefoot is painful walking and wearing his shoes.  He says his orthoses are doing well.  He did form a porokeratosis which he treated with acid.  He has significant callus right big toe but no pain noted. He presents for evaluation and treatment of the orthoses.   Review of Systems     Objective:   Physical Exam GENERAL APPEARANCE: Alert, conversant. Appropriately groomed. No acute distress.  VASCULAR: Pedal pulses are  palpable at  [facility] and PT bilateral.  Capillary refill time is immediate to all digits,  Normal temperature gradient.  Digital hair growth is present bilateral  NEUROLOGIC: sensation is normal to 5.07 monofilament at 5/5 sites bilateral.  Light touch is intact bilateral, Muscle strength normal.  MUSCULOSKELETAL: acceptable muscle strength, tone and stability bilateral.  Intrinsic muscluature intact bilateral.  Rectus appearance of foot and digits noted bilateral. Contracture IPJ  B/L.  DERMATOLOGIC: skin color, texture, and turgor are within normal limits.  No preulcerative lesions or ulcers  are seen, no interdigital maceration noted.  No open lesions present.  . No drainage noted. Porokeratosis sub 3 right forefoot.  NAILS  Thick disfigured discolored nails 1-5 right and 1 left.      Assessment:  Porokeratosis right foot  Callus right forefoot.  Callus right hallux    Plan:     Debridement of porokeratosis right. Debridement of callus right hallux  RTC prn   Gardiner Barefoot DPM

## 2016-05-14 DIAGNOSIS — N400 Enlarged prostate without lower urinary tract symptoms: Secondary | ICD-10-CM | POA: Insufficient documentation

## 2016-05-14 DIAGNOSIS — H9313 Tinnitus, bilateral: Secondary | ICD-10-CM | POA: Insufficient documentation

## 2016-05-14 DIAGNOSIS — G8929 Other chronic pain: Secondary | ICD-10-CM | POA: Insufficient documentation

## 2016-05-14 DIAGNOSIS — M542 Cervicalgia: Secondary | ICD-10-CM | POA: Insufficient documentation

## 2016-05-14 DIAGNOSIS — F418 Other specified anxiety disorders: Secondary | ICD-10-CM | POA: Insufficient documentation

## 2016-05-14 DIAGNOSIS — E559 Vitamin D deficiency, unspecified: Secondary | ICD-10-CM | POA: Insufficient documentation

## 2016-05-14 DIAGNOSIS — M6200 Separation of muscle (nontraumatic), unspecified site: Secondary | ICD-10-CM | POA: Insufficient documentation

## 2016-05-14 DIAGNOSIS — Z8546 Personal history of malignant neoplasm of prostate: Secondary | ICD-10-CM | POA: Insufficient documentation

## 2016-05-14 DIAGNOSIS — M1990 Unspecified osteoarthritis, unspecified site: Secondary | ICD-10-CM | POA: Insufficient documentation

## 2016-05-14 DIAGNOSIS — Z0000 Encounter for general adult medical examination without abnormal findings: Secondary | ICD-10-CM | POA: Insufficient documentation

## 2016-05-14 DIAGNOSIS — K589 Irritable bowel syndrome without diarrhea: Secondary | ICD-10-CM | POA: Insufficient documentation

## 2016-05-14 DIAGNOSIS — H16139 Photokeratitis, unspecified eye: Secondary | ICD-10-CM | POA: Insufficient documentation

## 2016-10-14 DIAGNOSIS — H6123 Impacted cerumen, bilateral: Secondary | ICD-10-CM | POA: Insufficient documentation

## 2016-10-14 DIAGNOSIS — H9012 Conductive hearing loss, unilateral, left ear, with unrestricted hearing on the contralateral side: Secondary | ICD-10-CM | POA: Insufficient documentation

## 2016-11-14 ENCOUNTER — Encounter: Payer: Self-pay | Admitting: Podiatry

## 2016-11-14 ENCOUNTER — Ambulatory Visit (INDEPENDENT_AMBULATORY_CARE_PROVIDER_SITE_OTHER): Payer: Medicare Other | Admitting: Podiatry

## 2016-11-14 DIAGNOSIS — M79676 Pain in unspecified toe(s): Secondary | ICD-10-CM | POA: Diagnosis not present

## 2016-11-14 DIAGNOSIS — B351 Tinea unguium: Secondary | ICD-10-CM | POA: Diagnosis not present

## 2016-11-14 NOTE — Progress Notes (Signed)
Subjective:     Patient ID: Lee Oconnell, male   DOB: 27-Oct-1933, 83 y.o.   MRN: [id number]  Foot Pain    this patient returns to the office stating that he has developed  Thick painful toenail on his big toe, right foot.  The nail is growing from 2 separate areas and is growing thick and disfigured.  He states he is not experiencing any pain or discomfort.  He has previously apply topical medication with minimal results.  He presents the office today for continued evaluation and treatment of this great toe nail.   Review of Systems     Objective:   Physical Exam GENERAL APPEARANCE: Alert, conversant. Appropriately groomed. No acute distress.  VASCULAR: Pedal pulses are  palpable at  [facility] and PT bilateral.  Capillary refill time is immediate to all digits,  Normal temperature gradient.  Digital hair growth is present bilateral  NEUROLOGIC: sensation is normal to 5.07 monofilament at 5/5 sites bilateral.  Light touch is intact bilateral, Muscle strength normal.  MUSCULOSKELETAL: acceptable muscle strength, tone and stability bilateral.  Intrinsic muscluature intact bilateral.  Rectus appearance of foot and digits noted bilateral. Contracture IPJ  B/L.  DERMATOLOGIC: skin color, texture, and turgor are within normal limits.  No preulcerative lesions or ulcers  are seen, no interdigital maceration noted.  No open lesions present.  . No drainage noted. Porokeratosis sub 3 right forefoot.  NAILS  Thick disfigured discolored nails 1-5 right and 1 left.      Assessment:  Dystropic nail right hallux.  Onychomycotic right hallux toenail    Plan:     Debridement of thick dystropic nail right foot.  No evidence of bacterial infection or drainage.  Patient was told to return to the office in 10 weeks for continued preventative foot care services      Gardiner Barefoot DPM

## 2016-12-23 IMAGING — MR MR CERVICAL SPINE W/O CM
5 series · 32 of 48 positions shown · non-contrast
Comparison: None.

CLINICAL DATA: Limited range of motion. Right-sided neck pain 5
years. No known injury.

EXAM:
MRI CERVICAL SPINE WITHOUT CONTRAST
TECHNIQUE: Multiplanar, multisequence MR imaging of the cervical spine was
performed. No intravenous contrast was administered.

[Series 3: T2 · sagittal · 3.0mm · 0.62mm/px · 6 of 12 slices shown (1 of 2)]
[im 1/12]
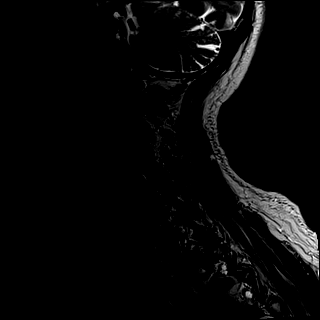
[im 3/12]
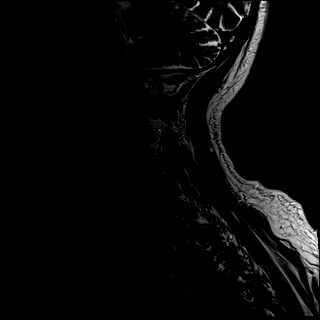
[im 5/12]
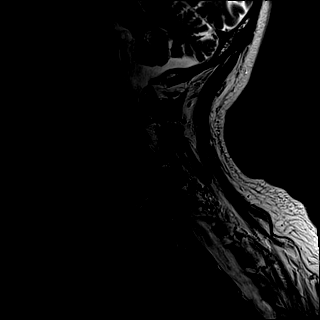
[im 7/12]
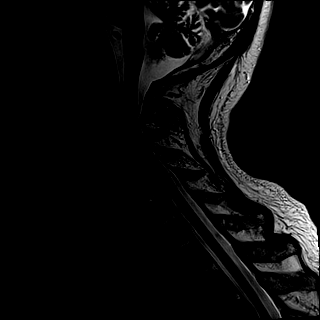
[im 9/12]
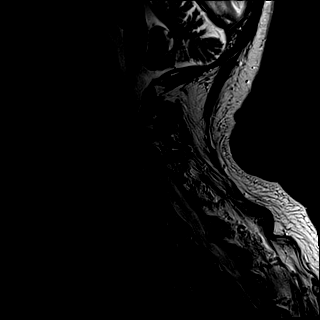
[im 12/12]
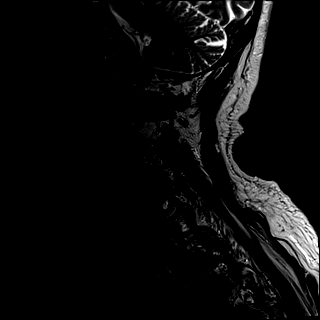

[Series 4: T1 · sagittal · 3.0mm · 0.62mm/px · 6 of 12 slices shown]
[im 1/12]
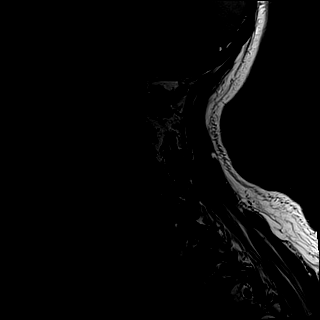
[im 3/12]
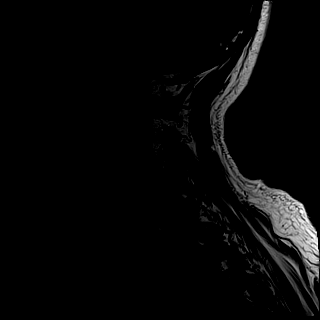
[im 5/12]
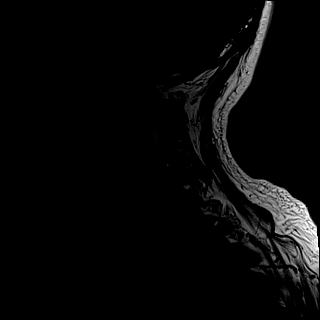
[im 7/12]
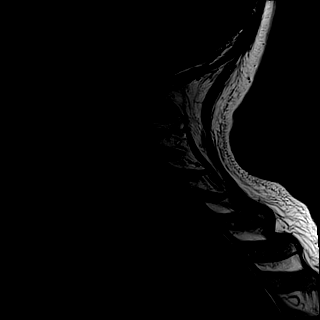
[im 9/12]
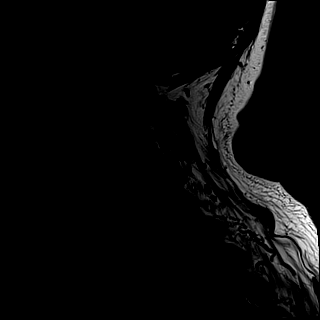
[im 12/12]
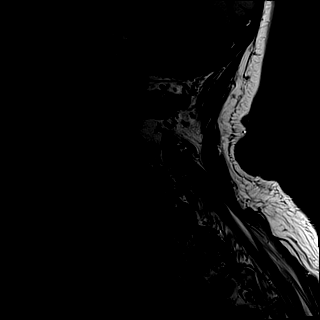

[Series 5: T2 · axial · 3.0mm · 0.70mm/px · z∈[-79,+20]mm · 9 of 30 slices shown (2 of 2)]
[im 1/30]
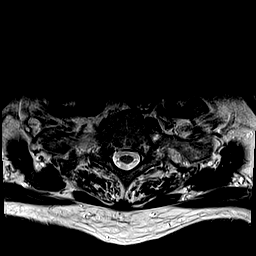
[im 5/30]
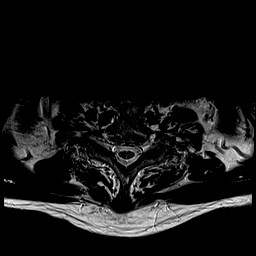
[im 9/30]
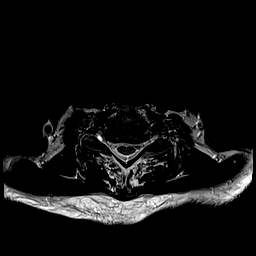
[im 13/30]
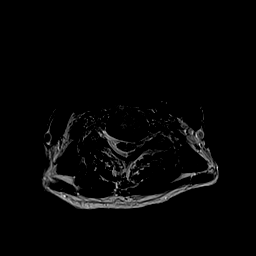
[im 15/30]
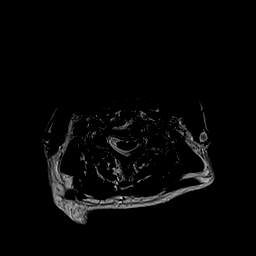
[im 17/30]
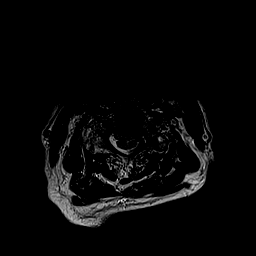
[im 21/30]
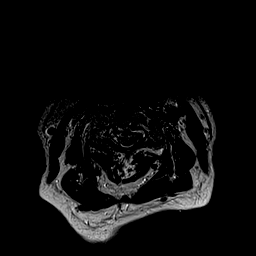
[im 25/30]
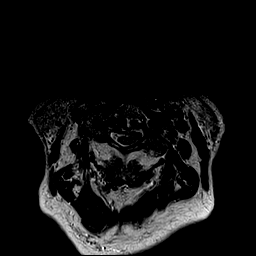
[im 30/30]
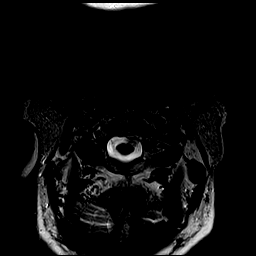

[Series 6: tir sag · sagittal · 3.0mm · 0.39mm/px · 6 of 12 slices shown]
[im 1/12]
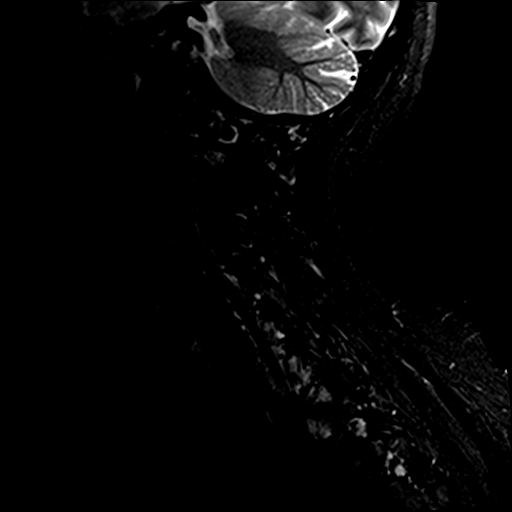
[im 3/12]
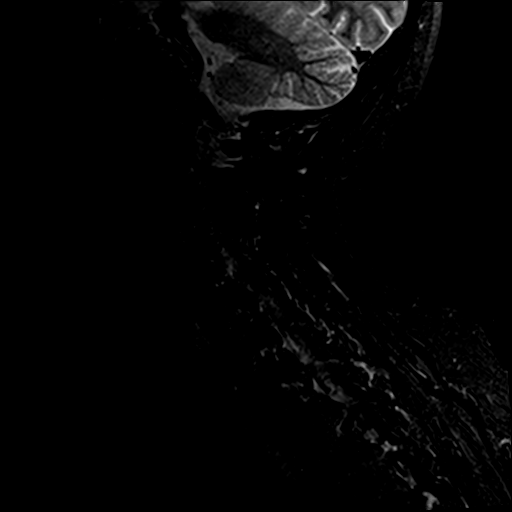
[im 5/12]
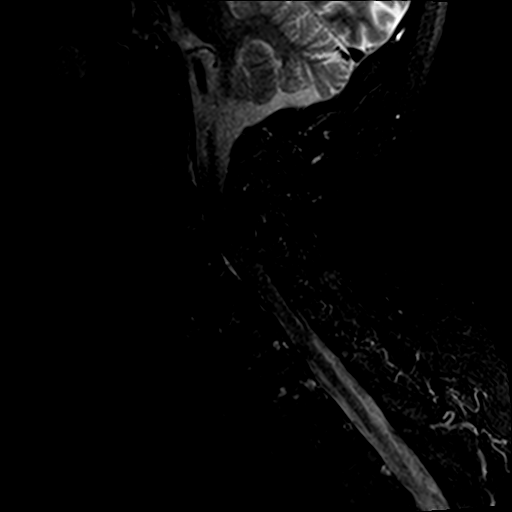
[im 7/12]
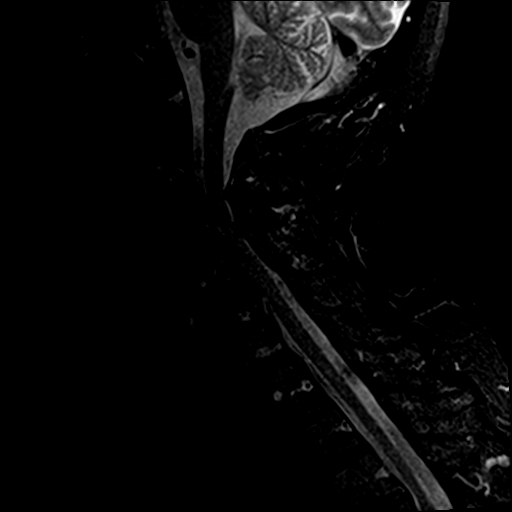
[im 9/12]
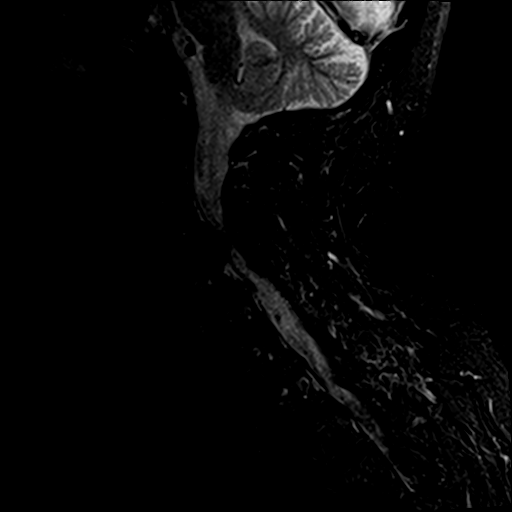
[im 12/12]
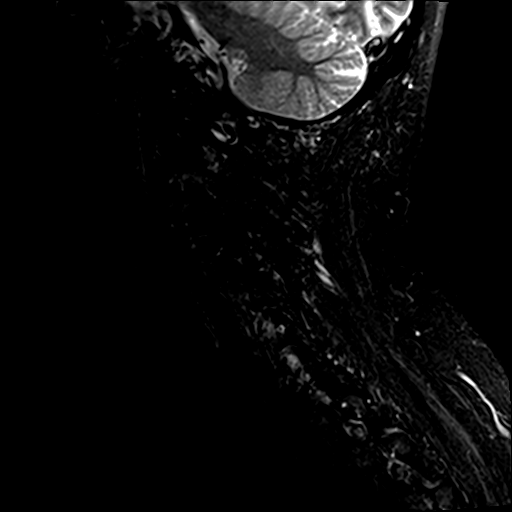

[Series 7: GRE · axial · 3.0mm · 0.35mm/px · z∈[-79,-24]mm · 5 of 30 slices shown]
[im 1/30]
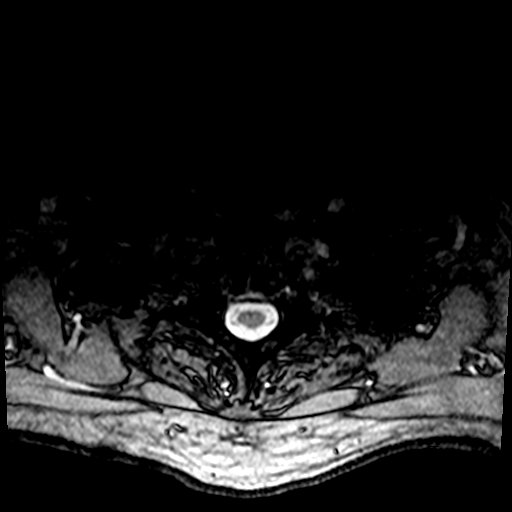
[im 5/30]
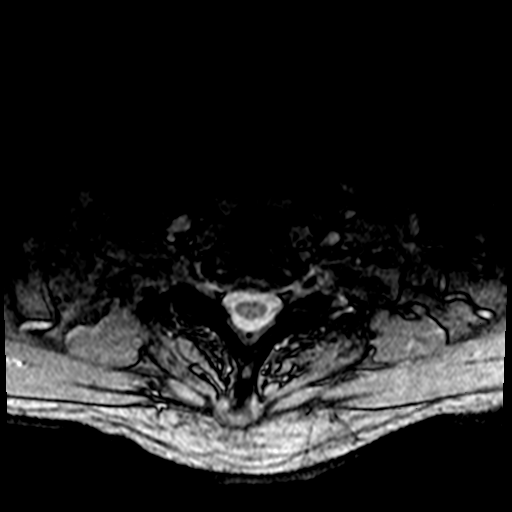
[im 9/30]
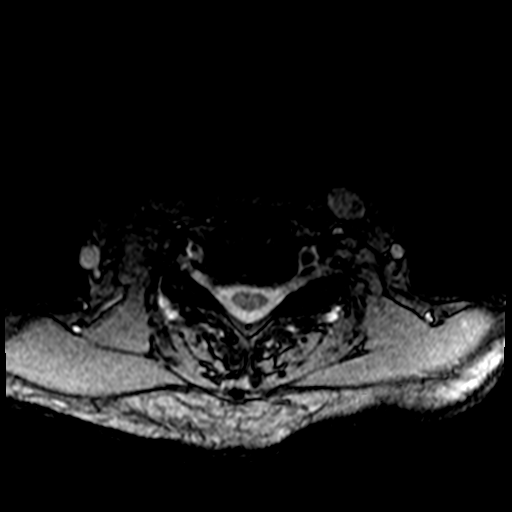
[im 13/30]
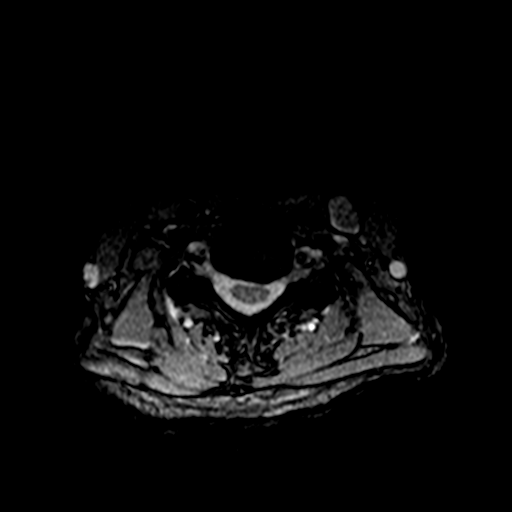
[im 17/30]
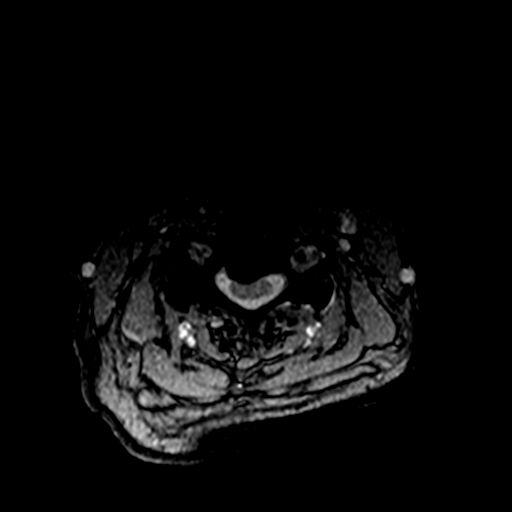

[32 of 48 positions shown; findings below may reference images not displayed]

FINDINGS: Alignment: Physiologic.

Vertebrae: No fracture, evidence of discitis, or bone lesion.

Cord: Normal signal and morphology.

Posterior Fossa, vertebral arteries, paraspinal tissues: Negative.

Disc levels:

Discs: Degenerative disc disease at C5-6 and C6-7. Anterior bridging
osteophytes at C5-6 and C6-7.

C2-3: No disc protrusion. Moderate left facet arthropathy resulting
in severe left foraminal stenosis. No right foraminal stenosis. No
central canal stenosis.

C3-4: Broad central and right paracentral disc osteophyte complex
versus ossification of the posterior longitudinal ligament. No
neural foraminal stenosis. Severe spinal stenosis.

C4-5: Ossification of the posterior longitudinal ligament deforming
the ventral cervical spinal cord. Severe right and mild left facet
arthropathy. Moderate right foraminal stenosis. No left foraminal
stenosis. Moderate spinal stenosis.

C5-6: Left uncovertebral degenerative change. No significant
foraminal stenosis. No central canal stenosis.

C6-7: No significant disc bulge. Mild bilateral foraminal narrowing.
No central canal stenosis.

C7-T1: No significant disc bulge. No neural foraminal stenosis. No
central canal stenosis.
IMPRESSION: 1. At C3-4 there is a broad central and right paracentral disc
osteophyte complex versus ossification of the posterior longitudinal
ligament.
2. At C4-5 there is ossification of the posterior longitudinal
ligament deforming the ventral cervical spinal cord. Severe right
and mild left facet arthropathy. Moderate right foraminal stenosis.
No left foraminal stenosis. Moderate spinal stenosis.
3. At C2-3 there is moderate left facet arthropathy with severe left
foraminal stenosis.

## 2017-01-23 ENCOUNTER — Encounter: Payer: Self-pay | Admitting: Podiatry

## 2017-01-23 ENCOUNTER — Ambulatory Visit (INDEPENDENT_AMBULATORY_CARE_PROVIDER_SITE_OTHER): Payer: Medicare Other | Admitting: Podiatry

## 2017-01-23 DIAGNOSIS — B351 Tinea unguium: Secondary | ICD-10-CM | POA: Diagnosis not present

## 2017-01-23 DIAGNOSIS — M79676 Pain in unspecified toe(s): Secondary | ICD-10-CM

## 2017-01-23 NOTE — Progress Notes (Addendum)
Subjective:     Patient ID: Lee Oconnell, male   DOB: August 05, 1933, 83 y.o.   MRN: [id number]  Foot Pain    this patient returns to the office stating that he has developed  Thick painful toenail on his big toe, right foot.  The nail is growing from 2 separate areas and is growing thick and disfigured.  He states he is not experiencing any pain or discomfort.  He has previously apply topical medication with minimal results.  He presents the office today for continued preventative footcare services.   Review of Systems     Objective:   Physical Exam GENERAL APPEARANCE: Alert, conversant. Appropriately groomed. No acute distress.  VASCULAR: Pedal pulses are  palpable at  [facility] and PT bilateral.  Capillary refill time is immediate to all digits,  Normal temperature gradient.  Digital hair growth is present bilateral  NEUROLOGIC: sensation is normal to 5.07 monofilament at 5/5 sites bilateral.  Light touch is intact bilateral, Muscle strength normal.  MUSCULOSKELETAL: acceptable muscle strength, tone and stability bilateral.  Intrinsic muscluature intact bilateral.  Rectus appearance of foot and digits noted bilateral. Contracture IPJ  B/L.  DERMATOLOGIC: skin color, texture, and turgor are within normal limits.  No preulcerative lesions or ulcers  are seen, no interdigital maceration noted.  No open lesions present.  . No drainage noted  NAILS  Thick disfigured discolored nails 1-5 right and 1 left.      Assessment:  Dystropic nail right hallux.  Onychomycotic right hallux toenail    Plan:     Debridement of thick dystropic nail right foot.  No evidence of bacterial infection or drainage.  Patient was told to return to the office in 10 weeks for continued preventative foot care services.  ABN signed for 2018      Gardiner Barefoot DPM

## 2017-04-03 ENCOUNTER — Ambulatory Visit: Payer: Medicare Other | Admitting: Podiatry

## 2017-07-03 ENCOUNTER — Encounter: Payer: Self-pay | Admitting: Podiatry

## 2017-07-03 ENCOUNTER — Ambulatory Visit (INDEPENDENT_AMBULATORY_CARE_PROVIDER_SITE_OTHER): Payer: Medicare Other | Admitting: Podiatry

## 2017-07-03 DIAGNOSIS — M79676 Pain in unspecified toe(s): Secondary | ICD-10-CM | POA: Diagnosis not present

## 2017-07-03 DIAGNOSIS — B351 Tinea unguium: Secondary | ICD-10-CM

## 2017-07-03 NOTE — Progress Notes (Signed)
Subjective:     Patient ID: Lee Oconnell, male   DOB: 03-Dec-1933, 84 y.o.   MRN: [id number]  HPI. This patient presents to the office for treatment of his long thick painful nails on both feet.  It has been approximately 6 months and he is now experiencing pain and discomfort walking and wearing his shoes.  Marland Kitchen He presents the office today for continued preventative foot care services.   Review of Systems     Objective:   Physical Exam General Appearance  Alert, conversant and in no acute stress.  Vascular  Dorsalis pedis and posterior tibial  pulses are palpable  bilaterally.  Capillary return is within normal limits  bilaterally. Temperature is within normal limits  bilaterally.  Neurologic  Senn-Weinstein monofilament wire test within normal limits  bilaterally. Muscle power within normal limits bilaterally.  Nails Thick disfigured discolored nails with subungual debris  from hallux to fifth toes right and 1 left.. No evidence of bacterial infection or drainage bilaterally.  Orthopedic  No limitations of motion of motion feet .  No crepitus or effusions noted.  No bony pathology or digital deformities noted.  Skin  normotropic skin with no porokeratosis noted bilaterally.  No signs of infections or ulcers noted.       Assessment:     Onychomycosis  B/L    Plan:      debridement and grinding of onychomycotic nails both feet.  Return to return to the office  in 6 months  for continued evaluation and treatment.   Gardiner Barefoot DPM

## 2018-01-01 ENCOUNTER — Ambulatory Visit: Payer: Medicare Other | Admitting: Podiatry

## 2019-01-27 DIAGNOSIS — Z636 Dependent relative needing care at home: Secondary | ICD-10-CM | POA: Insufficient documentation

## 2019-04-29 ENCOUNTER — Other Ambulatory Visit: Payer: Self-pay

## 2019-04-29 ENCOUNTER — Other Ambulatory Visit: Payer: Medicare Other | Admitting: Orthotics

## 2019-06-02 ENCOUNTER — Other Ambulatory Visit: Payer: Self-pay | Admitting: Family Medicine

## 2019-06-02 DIAGNOSIS — M5410 Radiculopathy, site unspecified: Secondary | ICD-10-CM

## 2019-09-26 ENCOUNTER — Other Ambulatory Visit: Payer: Self-pay

## 2019-09-26 ENCOUNTER — Ambulatory Visit (INDEPENDENT_AMBULATORY_CARE_PROVIDER_SITE_OTHER): Payer: Medicare Other | Admitting: Podiatrist

## 2019-09-26 VITALS — Temp 96.4°F

## 2019-09-26 DIAGNOSIS — M216X1 Other acquired deformities of right foot: Secondary | ICD-10-CM

## 2019-09-26 DIAGNOSIS — L851 Acquired keratosis [keratoderma] palmaris et plantaris: Secondary | ICD-10-CM | POA: Diagnosis not present

## 2019-09-26 DIAGNOSIS — M2022 Hallux rigidus, left foot: Secondary | ICD-10-CM

## 2019-09-26 DIAGNOSIS — M2021 Hallux rigidus, right foot: Secondary | ICD-10-CM

## 2019-09-26 MED ORDER — AMMONIUM LACTATE 12 % EX LOTN: 1.0000 "application " | TOPICAL_LOTION | CUTANEOUS | 0 refills | Status: DC | PRN

## 2019-09-26 NOTE — Patient Instructions (Signed)

## 2019-09-28 ENCOUNTER — Encounter: Payer: Self-pay | Admitting: Podiatrist

## 2019-09-28 NOTE — Progress Notes (Signed)
  Chief Complaint  Patient presents with  . Foot Problem    Painful lesions - ? calluses. Bilateral hallux and R plantar forefoot. R ft = less than 2 years, L ft = approx. 2 months. 4/10 pain.      HPI: Patient is 86 y.o. male who presents today for the concerns as listed above.    Review of Systems No fevers, chills, nausea, muscle aches, no difficulty breathing, no calf pain, no chest pain or shortness of breath.   Physical Exam  GENERAL APPEARANCE: Alert, conversant. Appropriately groomed. No acute distress.   VASCULAR: Pedal pulses palpable DP and PT bilateral.  Capillary refill time is immediate to all digits,  Proximal to distal cooling it warm to warm.  Digital perfusion adequate.   NEUROLOGIC: sensation is intact epicritically and protectively to 5.07 monofilament at 5/5 sites bilateral.  Light touch is intact bilateral, vibratory sensation intact bilateral, achilles tendon reflex is intact bilateral.   MUSCULOSKELETAL: acceptable muscle strength, tone and stability bilateral.  Contracture of digits 1-5 noted bilateral with notable hallux flexion present bilateral.  DERMATOLOGIC: skin is warm, supple, and dry. Hyperkeratotic lesions present sub hallux bilaterally and plantar forefoot on the right foot 4/5 metatarsal head region.  All lesionsx 3 have a porokeratotic center with ground glass appearance. Intact integument post debidement is noted. Fat pad atrophy is noted bilateral as well.    Assessment     ICD-10-CM   1. Hallux flexus of left foot  M20.22   2. Hallux flexus of right foot  M20.21   3. Prominent metatarsal head of right foot  M21.6X1   4. Acquired plantar porokeratosis  L85.1      Plan  Symptomatic lesions x 3 pared with a 15 blade without complication down to intact and healthy integument.  Padding recommended. Recommended using a cream with lactic acid to help with the dryness and callus buildup.  rx for amlactin called in.  He will return as needed for  continued preventive care.

## 2019-11-29 ENCOUNTER — Other Ambulatory Visit: Payer: Self-pay

## 2019-11-29 ENCOUNTER — Ambulatory Visit (INDEPENDENT_AMBULATORY_CARE_PROVIDER_SITE_OTHER): Payer: Medicare Other | Admitting: Podiatrist

## 2019-11-29 DIAGNOSIS — M216X1 Other acquired deformities of right foot: Secondary | ICD-10-CM

## 2019-11-29 DIAGNOSIS — M2021 Hallux rigidus, right foot: Secondary | ICD-10-CM

## 2019-11-29 DIAGNOSIS — L851 Acquired keratosis [keratoderma] palmaris et plantaris: Secondary | ICD-10-CM | POA: Diagnosis not present

## 2019-11-30 ENCOUNTER — Encounter: Payer: Self-pay | Admitting: Internal Medicine

## 2019-11-30 ENCOUNTER — Non-Acute Institutional Stay: Payer: Medicare Other | Admitting: Internal Medicine

## 2019-11-30 VITALS — BP 136/74 | HR 72 | Temp 97.3°F | Ht 72.0 in | Wt 182.6 lb

## 2019-11-30 DIAGNOSIS — K580 Irritable bowel syndrome with diarrhea: Secondary | ICD-10-CM | POA: Diagnosis not present

## 2019-11-30 DIAGNOSIS — E559 Vitamin D deficiency, unspecified: Secondary | ICD-10-CM

## 2019-11-30 DIAGNOSIS — F419 Anxiety disorder, unspecified: Secondary | ICD-10-CM

## 2019-11-30 DIAGNOSIS — Z8546 Personal history of malignant neoplasm of prostate: Secondary | ICD-10-CM

## 2019-11-30 DIAGNOSIS — Z0000 Encounter for general adult medical examination without abnormal findings: Secondary | ICD-10-CM

## 2019-11-30 NOTE — Progress Notes (Signed)
Location:  [place] of Service:  Clinic (12)  Provider:   Code Status:  Goals of Care: No flowsheet data found.   Chief Complaint  Patient presents with  . Medical Management of Chronic Issues    Patient here today to establish care.     HPI: Patient is a 86 y.o. male seen today for medical management of chronic diseases.    Patient Came to establish Care Does not have any Significant Past history. Does not take any meds His main Complains  Loss of Taste. Over few years he has noticed he has lost taste. No other issues like running nose or Dry Mouth.  Anxiety Recent worsening due to taking care of his Wife  IBS Has seen GI before Slight worsening due to Stress of his wife Recent history of Carpal Tunnel surgery   Otherwise doing well. Still drives. Does not exercise much  Memory is good. Walks with no assist     Past Medical History:  Diagnosis Date  . Arthritis   . Cancer ([place])   . Heart murmur     Past Surgical History:  Procedure Laterality Date  . APPENDECTOMY  1951  . CHOLECYSTECTOMY     2006  . HERNIA REPAIR  1951  . INSERTION PROSTATE RADIATION SEED  2005  . MENISCUS REPAIR  1995  . TONSILECTOMY, ADENOIDECTOMY, BILATERAL MYRINGOTOMY AND TUBES  1957  . trans-urethral resection of prostate  1999, 2003    No Known Allergies  Outpatient Encounter Medications as of 11/30/2019  Medication Sig  . ALPRAZolam (XANAX) 0.25 MG tablet Take 0.25 mg by mouth at bedtime as needed.  Marland Kitchen ammonium lactate (AMLACTIN) 12 % lotion Apply 1 application topically as needed for dry skin.  . Cholecalciferol (VITAMIN D PO) Take by mouth. (Patient not taking: Reported on 09/26/2019)  . Omega-3 Fatty Acids (OMEGA 3 PO) Take by mouth. (Patient not taking: Reported on 09/26/2019)   No facility-administered encounter medications on file as of 11/30/2019.    Review of Systems:  Review of Systems  Review of Systems  Constitutional: Negative for activity  change, appetite change, chills, diaphoresis, fatigue and fever.  HENT: Negative for mouth sores, postnasal drip, rhinorrhea, sinus pain and sore throat.   Respiratory: Negative for apnea, cough, chest tightness, shortness of breath and wheezing.   Cardiovascular: Negative for chest pain, palpitations and leg swelling.  Gastrointestinal: Negative for abdominal distention, abdominal pain, constipation, diarrhea, nausea and vomiting.  Genitourinary: Negative for dysuria and frequency.  Musculoskeletal: Negative for arthralgias, joint swelling and myalgias.  Skin: Negative for rash.  Neurological: Negative for dizziness, syncope, weakness, light-headedness and numbness.  Psychiatric/Behavioral: Negative for behavioral problems, confusion and sleep disturbance.     Health Maintenance  Topic Date Due  . COVID-19 Vaccine (1) Never done  . TETANUS/TDAP  Never done  . PNA vac Low Risk Adult (1 of 2 - PCV13) Never done  . INFLUENZA VACCINE  11/06/2019    Physical Exam: Vitals:   11/30/19 1514  BP: 136/74  Pulse: 72  Temp: (!) 97.3 F (36.3 C)  SpO2: 97%  Weight: 182 lb 9.6 oz (82.8 kg)  Height: 6' (1.829 m)   Body mass index is 24.77 kg/m. Physical Exam  Constitutional: Oriented to person, place, and time. Well-developed and well-nourished.  HENT:  Head: Normocephalic.  Ears Mild Wax Mouth/Throat: Oropharynx is clear and moist.  Eyes: Pupils are equal, round, and reactive to light.  Neck: Neck supple.  Cardiovascular:  Normal rate and normal heart sounds.  No murmur heard. Pulmonary/Chest: Effort normal and breath sounds normal. No respiratory distress. No wheezes. She has no rales.  Abdominal: Soft. Bowel sounds are normal. No distension. There is no tenderness. There is no rebound.  Musculoskeletal: No edema.  Lymphadenopathy: none Neurological: Alert and oriented to person, place, and time.  Skin: Skin is warm and dry.  Psychiatric: Normal mood and affect. Behavior is  normal. Thought content normal.    Labs reviewed: Basic Metabolic Panel: No results for input(s): NA, K, CL, CO2, GLUCOSE, BUN, CREATININE, CALCIUM, MG, PHOS, TSH in the last 8760 hours. Liver Function Tests: No results for input(s): AST, ALT, ALKPHOS, BILITOT, PROT, ALBUMIN in the last 8760 hours. No results for input(s): LIPASE, AMYLASE in the last 8760 hours. No results for input(s): AMMONIA in the last 8760 hours. CBC: No results for input(s): WBC, NEUTROABS, HGB, HCT, MCV, PLT in the last 8760 hours. Lipid Panel: No results for input(s): CHOL, HDL, LDLCALC, TRIG, CHOLHDL, LDLDIRECT in the last 8760 hours. No results found for: HGBA1C  Procedures since last visit: No results found.  Assessment/Plan Encounter for Medicare annual wellness exam Per his Previous PCP  Up to date on his Shots Is planning to get Covid Booster and Flu shot in Sept Anxiety Takes Xanax sometimes Has not taken any in past few months Vitamin D deficiency Check the level Irritable bowel syndrome with diarrhea Try Benefibre for and if does not help will try Questran  H/o Prostate Cancer Follow with Urology Loss of taste Exam is normal Has Appointment with ENT CareGiver [place] Discussed number of Options for his Wife   Labs/tests ordered:  * No order type specified * Next appt:  Visit date not found

## 2019-12-01 ENCOUNTER — Encounter: Payer: Self-pay | Admitting: Podiatrist

## 2019-12-01 NOTE — Progress Notes (Signed)
  Chief Complaint  Patient presents with  . Callouses    Painful calluses - bilateral hallux and forefoot. R worse than L. Pt stated, "The R foot pain is significantly worse. 7-8/10 when I walk. My pharmacy did not receive my Rx for Amlactin, so I gave up on it. I'm using Vaseline. I've tried to pad my shoe, and that has helped. I think I might need custom inserts".     HPI: Patient is 86 y.o. male who presents today for the concerns as listed above.  He relates pain when walking due to the painful calluses that are building up on his feet.  He is using vaseline for the dryness on the skin on the feet and that seem to help. He presents today asking if custom orthotics would be beneficial to help slow down the formation of the calluses and help him walk.     There are no problems to display for this patient.   Current Outpatient Medications on File Prior to Visit  Medication Sig Dispense Refill  . ALPRAZolam (XANAX) 0.25 MG tablet Take 0.25 mg by mouth at bedtime as needed.    Marland Kitchen ammonium lactate (AMLACTIN) 12 % lotion Apply 1 application topically as needed for dry skin. 400 g 0  . Cholecalciferol (VITAMIN D PO) Take by mouth. (Patient not taking: Reported on 09/26/2019)    . Omega-3 Fatty Acids (OMEGA 3 PO) Take by mouth. (Patient not taking: Reported on 09/26/2019)     No current facility-administered medications on file prior to visit.    No Known Allergies  Review of Systems No fevers, chills, nausea, muscle aches, no difficulty breathing, no calf pain, no chest pain or shortness of breath.   Physical Exam  GENERAL APPEARANCE: Alert, conversant. Appropriately groomed. No acute distress.   VASCULAR: Pedal pulses palpable DP and PT bilateral.  Capillary refill time is immediate to all digits,  Proximal to distal cooling it warm to warm.  Digital perfusion adequate.   NEUROLOGIC: sensation is intact epicritically and protectively to 5.07 monofilament at 5/5 sites bilateral.  Light  touch is intact bilateral, vibratory sensation intact bilateral, achilles tendon reflex is intact bilateral.   MUSCULOSKELETAL:. flexure contracture of hallux right is noted with associated callus at the plantar ip joint.  No pain, crepitus or limitation noted with foot and ankle range of motion bilateral.  Range of motion of the first mp joint decreased and prominent and plantar flexed metatarsals are present bilateral causing forefoot pain and calluses.   DERMATOLOGIC:  skin is warm, supple, and dry. Hyperkeratotic lesions present sub hallux bilaterally and plantar forefoot on the right foot 4/5 metatarsal head region.  All lesionsx 3 have a porokeratotic center with ground glass appearance. Intact integument post debidement is noted. Fat pad atrophy is noted bilateral as well.     Assessment    ICD-10-CM   1. Prominent metatarsal head of right foot  M21.6X1   2. Acquired plantar porokeratosis  L85.1   3. Hallux flexus of right foot  M20.21      Plan  Evaluated the patient and his orthopedic issues and agreed that orthotics would be beneficial.  Discussed making a device to cushion the forefoot and support the arch to decrease the friction from the motion of the foot inside the shoe.    Lesions pared with a 15 blade without complication.    Will set him up to see Liliane Channel for the orthotics.

## 2019-12-02 ENCOUNTER — Other Ambulatory Visit: Payer: Self-pay | Admitting: Internal Medicine

## 2019-12-02 DIAGNOSIS — K580 Irritable bowel syndrome with diarrhea: Secondary | ICD-10-CM

## 2019-12-02 DIAGNOSIS — R432 Parageusia: Secondary | ICD-10-CM

## 2019-12-02 DIAGNOSIS — F419 Anxiety disorder, unspecified: Secondary | ICD-10-CM

## 2019-12-02 DIAGNOSIS — E559 Vitamin D deficiency, unspecified: Secondary | ICD-10-CM

## 2019-12-02 DIAGNOSIS — Z8546 Personal history of malignant neoplasm of prostate: Secondary | ICD-10-CM

## 2019-12-02 DIAGNOSIS — Z0000 Encounter for general adult medical examination without abnormal findings: Secondary | ICD-10-CM

## 2019-12-05 ENCOUNTER — Other Ambulatory Visit: Payer: Self-pay

## 2019-12-05 DIAGNOSIS — R198 Other specified symptoms and signs involving the digestive system and abdomen: Secondary | ICD-10-CM | POA: Insufficient documentation

## 2019-12-05 DIAGNOSIS — K580 Irritable bowel syndrome with diarrhea: Secondary | ICD-10-CM

## 2019-12-05 DIAGNOSIS — R6889 Other general symptoms and signs: Secondary | ICD-10-CM | POA: Insufficient documentation

## 2019-12-05 DIAGNOSIS — F419 Anxiety disorder, unspecified: Secondary | ICD-10-CM

## 2019-12-05 DIAGNOSIS — R432 Parageusia: Secondary | ICD-10-CM | POA: Insufficient documentation

## 2019-12-05 DIAGNOSIS — Z0000 Encounter for general adult medical examination without abnormal findings: Secondary | ICD-10-CM

## 2019-12-05 DIAGNOSIS — R0989 Other specified symptoms and signs involving the circulatory and respiratory systems: Secondary | ICD-10-CM | POA: Insufficient documentation

## 2019-12-05 DIAGNOSIS — E559 Vitamin D deficiency, unspecified: Secondary | ICD-10-CM

## 2019-12-06 ENCOUNTER — Other Ambulatory Visit: Payer: Self-pay

## 2019-12-06 DIAGNOSIS — E559 Vitamin D deficiency, unspecified: Secondary | ICD-10-CM

## 2019-12-06 LAB — CBC
HCT: 42.3 % (ref 38.5–50.0)
Hemoglobin: 14.7 g/dL (ref 13.2–17.1)
MCH: 34.9 pg — ABNORMAL HIGH (ref 27.0–33.0)
MCHC: 34.8 g/dL (ref 32.0–36.0)
MCV: 100.5 fL — ABNORMAL HIGH (ref 80.0–100.0)
MPV: 10.3 fL (ref 7.5–12.5)
Platelets: 233 10*3/uL (ref 140–400)
RBC: 4.21 10*6/uL (ref 4.20–5.80)
RDW: 12.2 % (ref 11.0–15.0)
WBC: 6.1 10*3/uL (ref 3.8–10.8)

## 2019-12-06 LAB — COMPLETE METABOLIC PANEL WITH GFR
AG Ratio: 1.5 (calc) (ref 1.0–2.5)
ALT: 14 U/L (ref 9–46)
AST: 16 U/L (ref 10–35)
Albumin: 4.2 g/dL (ref 3.6–5.1)
Alkaline phosphatase (APISO): 59 U/L (ref 35–144)
BUN: 20 mg/dL (ref 7–25)
CO2: 23 mmol/L (ref 20–32)
Calcium: 9.2 mg/dL (ref 8.6–10.3)
Chloride: 107 mmol/L (ref 98–110)
Creat: 0.93 mg/dL (ref 0.70–1.11)
GFR, Est African American: 88 mL/min/{1.73_m2} (ref >=60)
GFR, Est Non African American: 76 mL/min/{1.73_m2} (ref >=60)
Globulin: 2.8 g/dL (calc) (ref 1.9–3.7)
Glucose, Bld: 83 mg/dL (ref 65–99)
Potassium: 4.2 mmol/L (ref 3.5–5.3)
Sodium: 139 mmol/L (ref 135–146)
Total Bilirubin: 0.7 mg/dL (ref 0.2–1.2)
Total Protein: 7 g/dL (ref 6.1–8.1)

## 2019-12-06 LAB — LIPID PANEL
Cholesterol: 197 mg/dL (ref <200)
HDL: 63 mg/dL (ref >=40)
LDL Cholesterol (Calc): 114 mg/dL (calc) — ABNORMAL HIGH
Non-HDL Cholesterol (Calc): 134 mg/dL (calc) — ABNORMAL HIGH (ref <130)
Total CHOL/HDL Ratio: 3.1 (calc) (ref <5.0)
Triglycerides: 95 mg/dL (ref <150)

## 2019-12-06 LAB — TSH: TSH: 1.99 mIU/L (ref 0.40–4.50)

## 2019-12-06 LAB — VITAMIN D 25 HYDROXY (VIT D DEFICIENCY, FRACTURES): Vit D, 25-Hydroxy: 22 ng/mL — ABNORMAL LOW (ref 30–100)

## 2019-12-06 MED ORDER — VITAMIN D 50 MCG (2000 UT) PO TABS: 2000.0000 [IU] | ORAL_TABLET | Freq: Every day | ORAL | 0 refills | Status: AC

## 2019-12-19 ENCOUNTER — Other Ambulatory Visit: Payer: Medicare Other | Admitting: Orthotics

## 2019-12-26 ENCOUNTER — Ambulatory Visit (INDEPENDENT_AMBULATORY_CARE_PROVIDER_SITE_OTHER): Payer: Medicare Other | Admitting: Orthotics

## 2019-12-26 ENCOUNTER — Other Ambulatory Visit: Payer: Self-pay

## 2019-12-26 DIAGNOSIS — M216X1 Other acquired deformities of right foot: Secondary | ICD-10-CM

## 2019-12-26 DIAGNOSIS — L851 Acquired keratosis [keratoderma] palmaris et plantaris: Secondary | ICD-10-CM

## 2019-12-26 DIAGNOSIS — M2021 Hallux rigidus, right foot: Secondary | ICD-10-CM

## 2019-12-26 NOTE — Progress Notes (Signed)
Cast for CMFO to address b/l fat pad atrophy and 1/3 keratomas R.  Plan on thin device (go in [employer]), offload 1/3 R...small met pad.

## 2020-01-23 ENCOUNTER — Ambulatory Visit: Payer: Medicare Other | Admitting: Orthotics

## 2020-01-23 ENCOUNTER — Other Ambulatory Visit: Payer: Self-pay

## 2020-01-23 DIAGNOSIS — M2021 Hallux rigidus, right foot: Secondary | ICD-10-CM

## 2020-01-23 DIAGNOSIS — M216X1 Other acquired deformities of right foot: Secondary | ICD-10-CM

## 2020-01-23 NOTE — Progress Notes (Signed)
Patient came in today to pick up custom made foot orthotics.  The goals were accomplished and the patient reported no dissatisfaction with said orthotics.  Patient was advised of breakin period and how to report any issues. 

## 2020-02-01 ENCOUNTER — Ambulatory Visit: Payer: Medicare Other | Admitting: Orthotics

## 2020-02-01 ENCOUNTER — Encounter: Payer: Self-pay | Admitting: Podiatry

## 2020-02-01 ENCOUNTER — Other Ambulatory Visit: Payer: Self-pay

## 2020-02-01 ENCOUNTER — Ambulatory Visit (INDEPENDENT_AMBULATORY_CARE_PROVIDER_SITE_OTHER): Payer: Medicare Other | Admitting: Podiatry

## 2020-02-01 DIAGNOSIS — L851 Acquired keratosis [keratoderma] palmaris et plantaris: Secondary | ICD-10-CM

## 2020-02-01 DIAGNOSIS — M216X1 Other acquired deformities of right foot: Secondary | ICD-10-CM | POA: Diagnosis not present

## 2020-02-01 DIAGNOSIS — M2022 Hallux rigidus, left foot: Secondary | ICD-10-CM

## 2020-02-01 NOTE — Progress Notes (Signed)
This patient presents to the office with painful callus right forefoot.  He says the callus is painful walking and wearing his shoes.  He has been seen by Dr.  Valentina Lucks and Liliane Channel the pedorthist but the callus keeps returning.  He says he occasionally feels pain on the outside of his right foot which he feels is compensation for painful porokeratosis.  He returns for continued evaluation and treatm  Vascular  Dorsalis pedis and posterior tibial pulses are palpable  B/L.  Capillary return  WNL.  Temperature gradient is  WNL.  Skin turgor  WNL  Sensorium  Senn Weinstein monofilament wire  WNL. Normal tactile sensation.  Nail Exam  Patient has normal nails with no evidence of bacterial or fungal infection.  Orthopedic  Exam  Muscle tone and muscle strength  WNL.  No limitations of motion feet  B/L.  No crepitus or joint effusion noted.  Foot type is unremarkable and digits show no abnormalities  Hallux malleus  B/L.  Plantar fleed second metatarsal right foot with hammering second toe right foot.  Skin  No open lesions.  Normal skin texture and turgor.  Porokeratosis sub 2 right foot.  Porokeratosis right foot  Plantar flexed second metatarsal right foot.  Debride porokeratosis sub 2 right foot with # 15 blade.  Gave paper for gel dispersion sheet.  Patient has already scheduled an appointment with Liliane Channel.   Gardiner Barefoot DPM

## 2020-02-01 NOTE — Progress Notes (Signed)
Added more dispersion by placing k-wedge underneate bottom.

## 2020-04-19 ENCOUNTER — Other Ambulatory Visit: Payer: Self-pay

## 2020-04-19 ENCOUNTER — Ambulatory Visit: Payer: Medicare Other | Admitting: Orthotics

## 2020-04-19 DIAGNOSIS — M2022 Hallux rigidus, left foot: Secondary | ICD-10-CM

## 2020-04-19 DIAGNOSIS — L851 Acquired keratosis [keratoderma] palmaris et plantaris: Secondary | ICD-10-CM

## 2020-04-19 DIAGNOSIS — M216X1 Other acquired deformities of right foot: Secondary | ICD-10-CM

## 2020-04-19 NOTE — Progress Notes (Signed)
Adjusted f/o by offloading 1st met head as it was callused pretty bad.  He ordered second pair ; Dawn ws to put in charges.

## 2020-05-09 ENCOUNTER — Ambulatory Visit (INDEPENDENT_AMBULATORY_CARE_PROVIDER_SITE_OTHER): Payer: Medicare Other | Admitting: Podiatry

## 2020-05-09 ENCOUNTER — Encounter: Payer: Self-pay | Admitting: Podiatry

## 2020-05-09 ENCOUNTER — Other Ambulatory Visit: Payer: Self-pay

## 2020-05-09 DIAGNOSIS — L84 Corns and callosities: Secondary | ICD-10-CM

## 2020-05-09 DIAGNOSIS — M2022 Hallux rigidus, left foot: Secondary | ICD-10-CM

## 2020-05-09 DIAGNOSIS — M216X1 Other acquired deformities of right foot: Secondary | ICD-10-CM

## 2020-05-09 DIAGNOSIS — M2021 Hallux rigidus, right foot: Secondary | ICD-10-CM

## 2020-05-09 DIAGNOSIS — L851 Acquired keratosis [keratoderma] palmaris et plantaris: Secondary | ICD-10-CM

## 2020-05-09 NOTE — Progress Notes (Signed)
This patient presents to the office with painful callus right forefoot and two new calluses on both big toes.  He says the callus is painful walking and wearing his shoes.  .  He says he occasionally feels pain on the outside of his right foot which he feels is compensation for painful porokeratosis.  He returns for continued evaluation and treatm  Vascular  Dorsalis pedis and posterior tibial pulses are palpable  B/L.  Capillary return  WNL.  Temperature gradient is  WNL.  Skin turgor  WNL  Sensorium  Senn Weinstein monofilament wire  WNL. Normal tactile sensation.  Nail Exam  Patient has normal nails with no evidence of bacterial or fungal infection.  Orthopedic  Exam  Muscle tone and muscle strength  WNL.  No limitations of motion feet  B/L.  No crepitus or joint effusion noted.  Foot type is unremarkable and digits show no abnormalities  Hallux malleus  B/L.  Plantar fleed second metatarsal right foot with hammering second toe right foot.  Hallux malleus  B/L.  Skin  No open lesions.  Normal skin texture and turgor.  Porokeratosis sub 2 right foot.  Callus under hallux plantarly due to hallux malleus.  Porokeratosis right foot  Plantar flexed second metatarsal right foot. Callus hallux  B/L  Debride porokeratosis sub 2 right foot, under hallux  B/L  with # 15 blade.    Patient has already scheduled an appointment with Liliane Channel. Told patient he should just make a return appointment in three months since the callus continues to return despite previous evaluation.   Padding for hallux   B/L.  RTC 3 months.   Gardiner Barefoot DPM

## 2020-05-17 ENCOUNTER — Other Ambulatory Visit: Payer: Self-pay

## 2020-05-17 ENCOUNTER — Other Ambulatory Visit: Payer: Medicare Other | Admitting: Orthotics

## 2020-05-30 ENCOUNTER — Non-Acute Institutional Stay: Payer: Medicare Other | Admitting: Internal Medicine

## 2020-05-30 ENCOUNTER — Other Ambulatory Visit: Payer: Self-pay

## 2020-05-30 VITALS — BP 138/70 | HR 86 | Temp 96.8°F | Ht 72.0 in | Wt 184.0 lb

## 2020-05-30 DIAGNOSIS — L84 Corns and callosities: Secondary | ICD-10-CM

## 2020-05-30 DIAGNOSIS — K580 Irritable bowel syndrome with diarrhea: Secondary | ICD-10-CM

## 2020-05-30 DIAGNOSIS — H905 Unspecified sensorineural hearing loss: Secondary | ICD-10-CM | POA: Diagnosis not present

## 2020-05-30 DIAGNOSIS — E559 Vitamin D deficiency, unspecified: Secondary | ICD-10-CM

## 2020-05-30 DIAGNOSIS — E162 Hypoglycemia, unspecified: Secondary | ICD-10-CM

## 2020-05-30 DIAGNOSIS — M545 Low back pain, unspecified: Secondary | ICD-10-CM

## 2020-05-30 DIAGNOSIS — Z636 Dependent relative needing care at home: Secondary | ICD-10-CM

## 2020-05-30 DIAGNOSIS — F419 Anxiety disorder, unspecified: Secondary | ICD-10-CM

## 2020-05-30 DIAGNOSIS — G8929 Other chronic pain: Secondary | ICD-10-CM

## 2020-05-30 DIAGNOSIS — H9120 Sudden idiopathic hearing loss, unspecified ear: Secondary | ICD-10-CM | POA: Insufficient documentation

## 2020-05-30 NOTE — Progress Notes (Signed)
Location:  [place] of Service:  Clinic (12)  Provider:   Code Status:  Goals of Care: No flowsheet data found.   Chief Complaint  Patient presents with  . Medical Management of Chronic Issues    Patient returns to the clinic for follow up. He has multiple concerns he would like discussed.     HPI: Patient is a 87 y.o. male seen today for medical management of chronic diseases.    Active issues  Sensory Neural Hearing loss in Left Ear Patient was seen by ENT today and had a sudden onset of sensorineural hearing loss he was started on prednisone.  IBS symptoms Patient he has had work-up in the past and was told that he has spastic colon.  He is having worsening of his symptoms due to caregiver stress.  Does not want any medications at this time History of anxiety Has been on Librium and Valium before.  Sometimes states Xanax as needed Prominent metatarsal Has been told by podiatry that he would need surgery.  He is not sure if he wants to go through that specially since he is the main caregiver for his wife Chronic low back pain Wants to know if he can have therapy evaluate and treat Questionable hypoglycemia He states that he was told that he needs to eat every 4 hours. Caregiver stress This seems to be the main issue precipitating all above problems.  His wife has history of multiple myeloma and is now having cognitive deficit.  Needing constant care.  Patient himself states independent in ADLs and IADLs.  Walks without any assist.  Still drives  Past Medical History:  Diagnosis Date  . Arthritis   . Cancer ([place])   . Heart murmur     Past Surgical History:  Procedure Laterality Date  . APPENDECTOMY  1951  . CHOLECYSTECTOMY     2006  . HERNIA REPAIR  1951  . INSERTION PROSTATE RADIATION SEED  2005  . MENISCUS REPAIR  1995  . TONSILECTOMY, ADENOIDECTOMY, BILATERAL MYRINGOTOMY AND TUBES  1957  . trans-urethral resection of prostate  1999, 2003   . WRIST SURGERY  April and July 2021    No Known Allergies  Outpatient Encounter Medications as of 05/30/2020  Medication Sig  . ALPRAZolam (XANAX) 0.25 MG tablet Take 0.25 mg by mouth at bedtime as needed.  Marland Kitchen ammonium lactate (AMLACTIN) 12 % lotion Apply 1 application topically as needed for dry skin.  . Cholecalciferol (VITAMIN D) 50 MCG (2000 UT) tablet Take 1 tablet (2,000 Units total) by mouth daily.  . Omega-3 Fatty Acids (OMEGA 3 PO) Take by mouth.    No facility-administered encounter medications on file as of 05/30/2020.    Review of Systems:  Review of Systems  All negative unless mentioned in presenting complaint  Health Maintenance  Topic Date Due  . TETANUS/TDAP  08/07/2015  . INFLUENZA VACCINE  11/06/2019  . COVID-19 Vaccine (3 - Booster for Moderna series) 11/06/2019  . PNA vac Low Risk Adult  Completed    Physical Exam: Vitals:   05/30/20 1338  BP: 138/70  Pulse: 86  Temp: (!) 96.8 F (36 C)  SpO2: 98%  Weight: 184 lb (83.5 kg)  Height: 6' (1.829 m)   Body mass index is 24.95 kg/m. Physical Exam Constitutional: Oriented to person, place, and time. Well-developed and well-nourished.  HENT:  Head: Normocephalic.  Mouth/Throat: Oropharynx is clear and moist.  Eyes: Pupils are equal, round, and reactive  to light.  Neck: Neck supple.  Cardiovascular: Normal rate and normal heart sounds.  No murmur heard. Pulmonary/Chest: Effort normal and breath sounds normal. No respiratory distress. No wheezes. She has no rales.  Abdominal: Soft. Bowel sounds are normal. No distension. There is no tenderness. There is no rebound.  Musculoskeletal: No edema.Does have Calluses in his right foot  Lymphadenopathy: none Neurological: Alert and oriented to person, place, and time.  Skin: Skin is warm and dry.  Psychiatric: Normal mood and affect. Behavior is normal. Thought content normal.   Labs reviewed: Basic Metabolic Panel: Recent Labs    12/02/19 1019  NA 139   K 4.2  CL 107  CO2 23  GLUCOSE 83  BUN 20  CREATININE 0.93  CALCIUM 9.2  TSH 1.99   Liver Function Tests: Recent Labs    12/02/19 1019  AST 16  ALT 14  BILITOT 0.7  PROT 7.0   No results for input(s): LIPASE, AMYLASE in the last 8760 hours. No results for input(s): AMMONIA in the last 8760 hours. CBC: Recent Labs    12/02/19 1019  WBC 6.1  HGB 14.7  HCT 42.3  MCV 100.5*  PLT 233   Lipid Panel: Recent Labs    12/02/19 1019  CHOL 197  HDL 63  LDLCALC 114*  TRIG 95  CHOLHDL 3.1   No results found for: HGBA1C  Procedures since last visit: No results found.  Assessment/Plan 1. Chronic bilateral low back pain without sciatica  - Ambulatory referral to Physical Therapy  2. Irritable bowel syndrome with diarrhea Does not want any further treatment  3. Anxiety Xanax Prn Discussed about starting antidepressant.  He is refusing at this time  4. Sensorineural hearing loss (SNHL) of left ear, unspecified hearing status on contralateral side Started on Prednisone per ENT for 10 days and then follow up  5. Caregiver stress Refusing antidepressant Is to hire more help for his wife  6. Hypoglycemia   7. Foot callus Follow-up with foot and ankle   Labs/tests ordered:  * No order type specified * Next appt:  11/22/2020

## 2020-06-04 ENCOUNTER — Encounter: Payer: Self-pay | Admitting: Podiatry

## 2020-06-04 ENCOUNTER — Ambulatory Visit (INDEPENDENT_AMBULATORY_CARE_PROVIDER_SITE_OTHER): Payer: Medicare Other | Admitting: Podiatry

## 2020-06-04 ENCOUNTER — Other Ambulatory Visit: Payer: Self-pay

## 2020-06-04 DIAGNOSIS — M2031 Hallux varus (acquired), right foot: Secondary | ICD-10-CM | POA: Diagnosis not present

## 2020-06-04 DIAGNOSIS — M2021 Hallux rigidus, right foot: Secondary | ICD-10-CM

## 2020-06-04 DIAGNOSIS — L851 Acquired keratosis [keratoderma] palmaris et plantaris: Secondary | ICD-10-CM | POA: Diagnosis not present

## 2020-06-04 DIAGNOSIS — M216X1 Other acquired deformities of right foot: Secondary | ICD-10-CM | POA: Diagnosis not present

## 2020-06-04 NOTE — Progress Notes (Signed)
  Subjective:  Patient ID: Lee Oconnell, male    DOB: 10-May-1933,  MRN: [id number]  Chief Complaint  Patient presents with  . Callouses    Right foot callus causing  discomfort     87 y.o. male presents with the above complaint. History confirmed with patient.  He is referred to me by Dr. Prudence Davidson for evaluation of right foot calluses of the first metatarsal head as well as the right hallux.  He primarily has pain under the hallux just distal to this has custom molded orthoses from the right that has multiple modifications to offload the area  Objective:  Physical Exam: warm, good capillary refill, no trophic changes or ulcerative lesions, normal DP and PT pulses and normal sensory exam.   Right Foot: Hallux malleus is present with a plantarflexed metatarsal head, there is hyperkeratosis about 1 and under the distal hallux.  He has pain on palpation to the flexor tendon complex just distal to the sesamoids at the base of the hallux.  No evidence of fracture or ecchymosis.  First ray is quite plantarflexed.  Deformity is fairly reducible.  Hammertoe deformities 2 through 5    Assessment:   1. Prominent metatarsal head of right foot   2. Acquired plantar porokeratosis   3. Hallux flexus of right foot   4. Hallux malleus of right foot      Plan:  Patient was evaluated and treated and all questions answered.  Discussed etiology and treatment options of the above deformities as well as the pathomechanics and treatment options for them.  Discussed hammertoe deformities as well as hallux malleus continue to retrograde pressure on the metatarsal head and sesamoid complex of the hallux.  I adjusted his orthotics once more again today and remove some of the offloading pad I think some of this is impinging on the base of the hallux and the distal sesamoid complex and causing pain.  Hopefully this alleviates some of it beyond the site of note there is much I can offer him other than urea cream which  we also discussed.  We did discuss the option of surgery which I think is a possibility and will require IPJ arthrodesis as well as Jones tenosuspension of the first ray to alleviate pressure.  He will consider this.  Currently taking care of his wife and does not think he would be able to have surgery at this time.  Return in about 2 months (around 08/02/2020).

## 2020-06-05 ENCOUNTER — Telehealth: Payer: Self-pay | Admitting: *Deleted

## 2020-06-05 NOTE — Telephone Encounter (Signed)
Patient called and stated that he needs to speak with Dr. Lyndel Safe directly. Stated that he is having problems with a Reaction to Prednisone he has been taking for a ear problem. Stated that he cannot get through to the Prescriber and he wants your advise.  Stated that he cannot sleep. Hypersensitivity. It is affecting her nerves.  Stated that he would like to talk with you personally for your advise. Please call [phone]

## 2020-06-06 NOTE — Telephone Encounter (Signed)
D/W the Patient . He had severe reaction to Oral Prednisone as started by his ENT Physician in [facility] for Unilateral Left Acute Hearing Loss. He was unable to sleep and unable to function. Feels much better now that he discontinued it. Is talking to them about intra tympanic Steroid injections.Also wants to talk about Antidepressant. I have told him that we will do that once he is off this treatment

## 2020-06-12 ENCOUNTER — Other Ambulatory Visit: Payer: Self-pay | Admitting: *Deleted

## 2020-06-12 MED ORDER — ALPRAZOLAM 0.25 MG PO TABS: 0.2500 mg | ORAL_TABLET | Freq: Every evening | ORAL | 1 refills | Status: DC | PRN

## 2020-06-12 NOTE — Telephone Encounter (Signed)
Patient called requesting refill on medication. Stated that Dr. Lyndel Safe reinstated that Rx at last visit.   Pended Rx and sent to [facility] for approval (Dr. Lyndel Safe out of office.)

## 2020-06-19 ENCOUNTER — Telehealth: Payer: Self-pay | Admitting: *Deleted

## 2020-06-19 MED ORDER — MIRTAZAPINE 7.5 MG PO TABS: 7.5000 mg | ORAL_TABLET | Freq: Every day | ORAL | 0 refills | Status: DC

## 2020-06-19 NOTE — Telephone Encounter (Signed)
I will call him Remeron 7.5 mg for 4 weeks for Insomnia and Depression. He does need to come and see me in 2-3 weeks for Reval Please make his appointment. Thanks

## 2020-06-19 NOTE — Telephone Encounter (Signed)
Patient called and stated that he would like to speak with Dr. Lyndel Safe regarding prescribing Mirtazapine to help him rest at night.  Stated that this is something his wife takes and his daughter, PA, also recommended.  Would like a Rx sent to pharmacy.  Please Advise.

## 2020-06-19 NOTE — Telephone Encounter (Signed)
Ok. Thanks. I have scheduled him for March 30 @2 :00 at [facility] if you will let him know when you speak with him.

## 2020-06-20 NOTE — Telephone Encounter (Addendum)
Discussed with the patient to start taking Remeron 7.5 mg QHS. It will help him with sleep and Anxiety and depressive symptoms he is having. He wants his March appointment Rescheduled as he has another appointment that day Mickey [facility]ber can you call and reschedule his Appointment ?

## 2020-06-21 NOTE — Telephone Encounter (Signed)
Called patient. No answer. LMOM to return call (to reschedule apnt).

## 2020-06-29 ENCOUNTER — Other Ambulatory Visit: Payer: Self-pay | Admitting: Internal Medicine

## 2020-06-29 DIAGNOSIS — F419 Anxiety disorder, unspecified: Secondary | ICD-10-CM

## 2020-06-29 DIAGNOSIS — F339 Major depressive disorder, recurrent, unspecified: Secondary | ICD-10-CM

## 2020-06-29 NOTE — Progress Notes (Signed)
Patient said he did not like how Remeron made him feel. He wants me to do Psych Consult. Will Place the Referal

## 2020-07-04 ENCOUNTER — Encounter: Payer: Self-pay | Admitting: Internal Medicine

## 2020-07-14 ENCOUNTER — Other Ambulatory Visit: Payer: Self-pay | Admitting: Internal Medicine

## 2020-08-08 ENCOUNTER — Ambulatory Visit (INDEPENDENT_AMBULATORY_CARE_PROVIDER_SITE_OTHER): Payer: Medicare Other | Admitting: Podiatry

## 2020-08-08 ENCOUNTER — Other Ambulatory Visit: Payer: Self-pay

## 2020-08-08 ENCOUNTER — Encounter: Payer: Self-pay | Admitting: Podiatry

## 2020-08-08 DIAGNOSIS — M2022 Hallux rigidus, left foot: Secondary | ICD-10-CM

## 2020-08-08 DIAGNOSIS — L84 Corns and callosities: Secondary | ICD-10-CM | POA: Diagnosis not present

## 2020-08-08 NOTE — Progress Notes (Signed)
This patient presents to the office with painful callus right forefoot.  He says the callus is localized and pain has lessened.  He says the callus is less painful walking and wearing his shoes.  Marland Kitchen  He returns for continued evaluation and treatm  Vascular  Dorsalis pedis and posterior tibial pulses are palpable  B/L.  Capillary return  WNL.  Temperature gradient is  WNL.  Skin turgor  WNL  Sensorium  Senn Weinstein monofilament wire  WNL. Normal tactile sensation.  Nail Exam  Patient has normal nails with no evidence of bacterial or fungal infection.  Orthopedic  Exam  Muscle tone and muscle strength  WNL.  No limitations of motion feet  B/L.  No crepitus or joint effusion noted.  Foot type is unremarkable and digits show no abnormalities  Hallux malleus  B/L.  Plantar fleed second metatarsal right foot with hammering second toe right foot.  Hallux malleus  B/L.  Skin  No open lesions.  Normal skin texture and turgor.  Porokeratosis sub 2 right foot.  Callus under hallux plantarly due to hallux malleus.  Porokeratosis right foot  Plantar flexed second metatarsal right foot.    Debride porokeratosis sub 1 right foot,   with # 15 blade.    Patient says the padding technique has helped and he only has painful localized callus sub 1 right.   Patient says since the padding has helped he will see the inhouse podiatrist at his home.  RTC prn   Gardiner Barefoot DPM

## 2020-08-13 ENCOUNTER — Ambulatory Visit: Payer: Medicare Other | Admitting: Podiatry

## 2020-08-24 ENCOUNTER — Other Ambulatory Visit: Payer: Medicare Other

## 2020-08-24 ENCOUNTER — Other Ambulatory Visit: Payer: Self-pay

## 2020-08-24 DIAGNOSIS — F419 Anxiety disorder, unspecified: Secondary | ICD-10-CM

## 2020-08-24 DIAGNOSIS — E162 Hypoglycemia, unspecified: Secondary | ICD-10-CM

## 2020-08-24 DIAGNOSIS — H905 Unspecified sensorineural hearing loss: Secondary | ICD-10-CM

## 2020-08-24 DIAGNOSIS — E559 Vitamin D deficiency, unspecified: Secondary | ICD-10-CM

## 2020-08-24 DIAGNOSIS — K580 Irritable bowel syndrome with diarrhea: Secondary | ICD-10-CM

## 2020-08-24 DIAGNOSIS — R319 Hematuria, unspecified: Secondary | ICD-10-CM

## 2020-08-26 LAB — URINALYSIS, ROUTINE W REFLEX MICROSCOPIC
Bilirubin Urine: NEGATIVE
Glucose, UA: NEGATIVE
Hgb urine dipstick: NEGATIVE
Ketones, ur: NEGATIVE
Leukocytes,Ua: NEGATIVE
Nitrite: NEGATIVE
Protein, ur: NEGATIVE
Specific Gravity, Urine: 1.016 (ref 1.001–1.035)
pH: 6 (ref 5.0–8.0)

## 2020-08-26 LAB — URINE CULTURE
MICRO NUMBER:: 11918672
Result:: NO GROWTH
SPECIMEN QUALITY:: ADEQUATE

## 2020-11-12 ENCOUNTER — Other Ambulatory Visit: Payer: Self-pay | Admitting: Internal Medicine

## 2020-11-12 DIAGNOSIS — K580 Irritable bowel syndrome with diarrhea: Secondary | ICD-10-CM

## 2020-11-12 DIAGNOSIS — E559 Vitamin D deficiency, unspecified: Secondary | ICD-10-CM

## 2020-11-12 DIAGNOSIS — F339 Major depressive disorder, recurrent, unspecified: Secondary | ICD-10-CM

## 2020-11-12 DIAGNOSIS — E782 Mixed hyperlipidemia: Secondary | ICD-10-CM

## 2020-11-22 ENCOUNTER — Other Ambulatory Visit: Payer: Self-pay

## 2020-11-22 DIAGNOSIS — E559 Vitamin D deficiency, unspecified: Secondary | ICD-10-CM

## 2020-11-22 DIAGNOSIS — F339 Major depressive disorder, recurrent, unspecified: Secondary | ICD-10-CM

## 2020-11-22 DIAGNOSIS — K580 Irritable bowel syndrome with diarrhea: Secondary | ICD-10-CM

## 2020-11-22 DIAGNOSIS — E782 Mixed hyperlipidemia: Secondary | ICD-10-CM

## 2020-11-22 LAB — COMPLETE METABOLIC PANEL WITHOUT GFR
AG Ratio: 1.8 (calc) (ref 1.0–2.5)
ALT: 12 U/L (ref 9–46)
AST: 16 U/L (ref 10–35)
Albumin: 4.4 g/dL (ref 3.6–5.1)
Alkaline phosphatase (APISO): 56 U/L (ref 35–144)
BUN: 20 mg/dL (ref 7–25)
CO2: 24 mmol/L (ref 20–32)
Calcium: 9.3 mg/dL (ref 8.6–10.3)
Chloride: 107 mmol/L (ref 98–110)
Creat: 0.89 mg/dL (ref 0.70–1.22)
Globulin: 2.5 g/dL (ref 1.9–3.7)
Glucose, Bld: 77 mg/dL (ref 65–99)
Potassium: 4.1 mmol/L (ref 3.5–5.3)
Sodium: 141 mmol/L (ref 135–146)
Total Bilirubin: 1.1 mg/dL (ref 0.2–1.2)
Total Protein: 6.9 g/dL (ref 6.1–8.1)
eGFR: 85 mL/min/1.73m2

## 2020-11-22 LAB — LIPID PANEL
Cholesterol: 183 mg/dL
HDL: 63 mg/dL
LDL Cholesterol (Calc): 102 mg/dL — ABNORMAL HIGH
Non-HDL Cholesterol (Calc): 120 mg/dL
Total CHOL/HDL Ratio: 2.9 (calc)
Triglycerides: 85 mg/dL

## 2020-11-22 LAB — CBC WITH DIFFERENTIAL/PLATELET
Absolute Monocytes: 627 cells/uL (ref 200–950)
Basophils Absolute: 57 cells/uL (ref 0–200)
Basophils Relative: 1 %
Eosinophils Absolute: 171 cells/uL (ref 15–500)
Eosinophils Relative: 3 %
HCT: 42.9 % (ref 38.5–50.0)
Hemoglobin: 14.6 g/dL (ref 13.2–17.1)
Lymphs Abs: 1465 cells/uL (ref 850–3900)
MCH: 34 pg — ABNORMAL HIGH (ref 27.0–33.0)
MCHC: 34 g/dL (ref 32.0–36.0)
MCV: 99.8 fL (ref 80.0–100.0)
MPV: 10.4 fL (ref 7.5–12.5)
Monocytes Relative: 11 %
Neutro Abs: 3380 cells/uL (ref 1500–7800)
Neutrophils Relative %: 59.3 %
Platelets: 220 10*3/uL (ref 140–400)
RBC: 4.3 10*6/uL (ref 4.20–5.80)
RDW: 12.4 % (ref 11.0–15.0)
Total Lymphocyte: 25.7 %
WBC: 5.7 10*3/uL (ref 3.8–10.8)

## 2020-11-22 LAB — VITAMIN D 25 HYDROXY (VIT D DEFICIENCY, FRACTURES): Vit D, 25-Hydroxy: 49 ng/mL (ref 30–100)

## 2020-11-22 LAB — TSH: TSH: 0.9 mIU/L (ref 0.40–4.50)

## 2020-11-28 ENCOUNTER — Other Ambulatory Visit: Payer: Self-pay

## 2020-11-28 ENCOUNTER — Encounter: Payer: Self-pay | Admitting: Internal Medicine

## 2020-11-28 ENCOUNTER — Non-Acute Institutional Stay: Payer: Medicare Other | Admitting: Internal Medicine

## 2020-11-28 VITALS — BP 128/70 | HR 88 | Temp 98.7°F | Resp 18 | Ht 72.0 in | Wt 183.1 lb

## 2020-11-28 DIAGNOSIS — M545 Low back pain, unspecified: Secondary | ICD-10-CM

## 2020-11-28 DIAGNOSIS — E782 Mixed hyperlipidemia: Secondary | ICD-10-CM

## 2020-11-28 DIAGNOSIS — F339 Major depressive disorder, recurrent, unspecified: Secondary | ICD-10-CM | POA: Diagnosis not present

## 2020-11-28 DIAGNOSIS — G8929 Other chronic pain: Secondary | ICD-10-CM

## 2020-11-28 DIAGNOSIS — F419 Anxiety disorder, unspecified: Secondary | ICD-10-CM | POA: Diagnosis not present

## 2020-11-28 DIAGNOSIS — K580 Irritable bowel syndrome with diarrhea: Secondary | ICD-10-CM

## 2020-11-28 DIAGNOSIS — E559 Vitamin D deficiency, unspecified: Secondary | ICD-10-CM

## 2020-11-28 DIAGNOSIS — H905 Unspecified sensorineural hearing loss: Secondary | ICD-10-CM

## 2020-11-28 DIAGNOSIS — Z636 Dependent relative needing care at home: Secondary | ICD-10-CM

## 2020-11-29 NOTE — Progress Notes (Signed)
Location:  [place] of Service:  Clinic (12)  Provider:   Code Status:  Goals of Care:  Advanced Directives 11/28/2020  Does Patient Have a Medical Advance Directive? No  Would patient like information on creating a medical advance directive? No - Patient declined     Chief Complaint  Patient presents with   Medical Management of Chronic Issues    6 month follow up. Discuss labs.   Health Maintenance    Discuss need for tdtdap vaccine and updated influenza vaccine    HPI: Patient is a 87 y.o. male seen today for medical management of chronic diseases.    Sensorineural hearing loss left ear Follows with ENT in [facility] IBS symptoms, chronic low back pain, Prominent metatarsal planning for surgery with podiatry Anxiety This seems to be his main problem right now.  He is seeing a psychologist and therapist and they are recommending to start him on sertraline.  He also has a lot of caregiver stress as his wife has a history of multiple myeloma and has some early cognitive deficit.  Doing well otherwise.  Takes trazodone for insomnia which is helping.  Weight is stable.  Continues to drive  Past Medical History:  Diagnosis Date   Arthritis    Cancer ([place])    Heart murmur     Past Surgical History:  Procedure Laterality Date   APPENDECTOMY  1951   CHOLECYSTECTOMY     2006   HERNIA REPAIR  1951   INSERTION PROSTATE RADIATION SEED  2005   MENISCUS REPAIR  1995   TONSILECTOMY, ADENOIDECTOMY, BILATERAL MYRINGOTOMY AND TUBES  1957   trans-urethral resection of prostate  1999, 2003   WRIST SURGERY  April and July 2021    No Known Allergies  Outpatient Encounter Medications as of 11/28/2020  Medication Sig   Cholecalciferol (VITAMIN D) 50 MCG (2000 UT) tablet Take 1 tablet (2,000 Units total) by mouth daily.   traZODone (DESYREL) 50 MG tablet Take 50-100 mg by mouth at bedtime as needed.   [DISCONTINUED] ALPRAZolam (XANAX) 0.25 MG tablet Take 1  tablet (0.25 mg total) by mouth at bedtime as needed.   [DISCONTINUED] ammonium lactate (AMLACTIN) 12 % lotion Apply 1 application topically as needed for dry skin.   [DISCONTINUED] mirtazapine (REMERON) 7.5 MG tablet TAKE 1 TABLET BY MOUTH AT BEDTIME.   [DISCONTINUED] Omega-3 Fatty Acids (OMEGA 3 PO) Take by mouth.    [DISCONTINUED] predniSONE (DELTASONE) 10 MG tablet PLEASE SEE ATTACHED FOR DETAILED DIRECTIONS   No facility-administered encounter medications on file as of 11/28/2020.    Review of Systems:  Review of Systems  Constitutional:  Positive for activity change.  HENT: Negative.    Respiratory: Negative.    Cardiovascular: Negative.   Gastrointestinal:  Positive for diarrhea.  Genitourinary: Negative.   Musculoskeletal:  Positive for arthralgias, back pain, neck pain and neck stiffness.  Skin: Negative.   Neurological:  Negative for dizziness.  Psychiatric/Behavioral:  Positive for dysphoric mood and sleep disturbance. The patient is nervous/anxious.    Health Maintenance  Topic Date Due   TETANUS/TDAP  08/07/2015   COVID-19 Vaccine (3 - Booster for Moderna series) 10/06/2019   INFLUENZA VACCINE  11/05/2020   PNA vac Low Risk Adult  Completed   Zoster Vaccines- Shingrix  Completed   HPV VACCINES  Aged Out    Physical Exam: Vitals:   11/28/20 1336  BP: 128/70  Pulse: 88  Resp: 18  Temp: 98.7 F (  37.1 C)  SpO2: 99%  Weight: 183 lb 1.6 oz (83.1 kg)  Height: 6' (1.829 m)   Body mass index is 24.83 kg/m. Physical Exam Constitutional: Oriented to person, place, and time. Well-developed and well-nourished.  HENT:  Head: Normocephalic.  Mouth/Throat: Oropharynx is clear and moist.  Eyes: Pupils are equal, round, and reactive to light.  Neck: Neck supple.  Cardiovascular: Normal rate and normal heart sounds.  No murmur heard. Pulmonary/Chest: Effort normal and breath sounds normal. No respiratory distress. No wheezes. She has no rales.  Abdominal: Soft. Bowel  sounds are normal. No distension. There is no tenderness. There is no rebound.  Musculoskeletal: No edema.  Lymphadenopathy: none Neurological: Alert and oriented to person, place, and time.  Skin: Skin is warm and dry.  Psychiatric: Normal mood and affect. Behavior is normal. Thought content normal.   Labs reviewed: Basic Metabolic Panel: Recent Labs    12/02/19 1019 11/21/20 0853  NA 139 141  K 4.2 4.1  CL 107 107  CO2 23 24  GLUCOSE 83 77  BUN 20 20  CREATININE 0.93 0.89  CALCIUM 9.2 9.3  TSH 1.99 0.90   Liver Function Tests: Recent Labs    12/02/19 1019 11/21/20 0853  AST 16 16  ALT 14 12  BILITOT 0.7 1.1  PROT 7.0 6.9   No results for input(s): LIPASE, AMYLASE in the last 8760 hours. No results for input(s): AMMONIA in the last 8760 hours. CBC: Recent Labs    12/02/19 1019 11/21/20 0853  WBC 6.1 5.7  NEUTROABS  --  3,380  HGB 14.7 14.6  HCT 42.3 42.9  MCV 100.5* 99.8  PLT 233 220   Lipid Panel: Recent Labs    12/02/19 1019 11/21/20 0853  CHOL 197 183  HDL 63 63  LDLCALC 114* 102*  TRIG 95 85  CHOLHDL 3.1 2.9   No results found for: HGBA1C  Procedures since last visit: No results found.  Assessment/Plan  Depression, recurrent ([place]) Seeing Therapist and plan to start Sertraline  Anxiety Sometimes take Xanax Irritable bowel syndrome with diarrhea No Issues Manageble Chronic bilateral low back pain without sciatica Tylenol prn Vitamin D deficiency On supplement Level good Mixed hyperlipidemia Diet and Exercise Sensorineural hearing loss (SNHL) of left ear, unspecified hearing status on contralateral side Follows with ENT Caregiver stress Continue to follow with Therapist  Labs/tests ordered:  * No order type specified * Next appt:  05/15/2021

## 2020-12-24 ENCOUNTER — Other Ambulatory Visit: Payer: Self-pay | Admitting: Urology

## 2020-12-24 MED ORDER — GEMCITABINE CHEMO FOR BLADDER INSTILLATION 2000 MG: 2000.0000 mg | Freq: Once | INTRAVENOUS | Status: AC

## 2020-12-25 ENCOUNTER — Telehealth: Payer: Self-pay | Admitting: *Deleted

## 2020-12-25 NOTE — Telephone Encounter (Signed)
Patient called and stated that he is able to get his Covid Booster tomorrow at [facility] and wants to know if it is ok because this one will be Pfizer and all the other ones he received have been [place].   Is it ok for him to receive the [place] Covid Injection tomorrow.   Please Advise.

## 2020-12-26 NOTE — Progress Notes (Addendum)
COVID swab appointment:N/A  COVID Vaccine Completed: Yes x2 Date COVID Vaccine completed:  04-11-19, 05-09-19 Has received booster:  Yes x1 COVID vaccine manufacturer: [place]   Date of COVID positive in last 90 days:  No  PCP - Veleta Miners, MD Cardiologist - N/A  Chest x-ray - N/A EKG - N/A Stress Test - N/A ECHO - N/A Cardiac Cath - N/A Pacemaker/ICD device last checked: Spinal Cord Stimulator:  Sleep Study - N/A CPAP -   Fasting Blood Sugar - N/A Checks Blood Sugar _____ times a day  Blood Thinner Instructions:  N/A Aspirin Instructions: Last Dose:  Activity level:  Can go up a flight of stairs and perform activities of daily living without stopping and without symptoms of chest pain or shortness of breath.  Able to exercise without symptoms.  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  Anesthesia review:  N/A  Patient denies shortness of breath, fever, cough and chest pain at PAT appointment   Patient verbalized understanding of instructions that were given to them at the PAT appointment. Patient was also instructed that they will need to review over the PAT instructions again at home before surgery.

## 2020-12-26 NOTE — Patient Instructions (Addendum)
DUE TO COVID-19 ONLY ONE VISITOR IS ALLOWED TO COME WITH YOU AND STAY IN THE WAITING ROOM ONLY DURING PRE OP AND PROCEDURE.   **NO VISITORS ARE ALLOWED IN THE SHORT STAY AREA OR RECOVERY ROOM!!**         Your procedure is scheduled on:  Tuesday, 01-08-21   Report to [facility] Main Entrance    Report to admitting at 9:05 AM   Call this number if you have problems the morning of surgery [phone]   Do not eat food :After Midnight.   May have liquids until 8:15 AM day of surgery  CLEAR LIQUID DIET  Foods Allowed                                                                     Foods Excluded  Water, Black Coffee and tea, regular and decaf            liquids that you cannot  Plain Jell-O in any flavor  (No red)                                  see through such as: Fruit ices (not with fruit pulp)                                      milk, soups, orange juice              Iced Popsicles (No red)                                      All solid food                                   Apple juices Sports drinks like Gatorade (No red) Lightly seasoned clear broth or consume(fat free) Sugar, honey syrup       Oral Hygiene is also important to reduce your risk of infection.                                    Remember - BRUSH YOUR TEETH THE MORNING OF SURGERY WITH YOUR REGULAR TOOTHPASTE   Do NOT smoke after Midnight   Take these medicines the morning of surgery with A SIP OF WATER:  Fluoxetine                              You may not have any metal on your body including jewelry, and body piercing             Do not wear lotions, powders, cologne, or deodorant              Men may shave face and neck.   Do not bring valuables to the hospital. [place].   Contacts, dentures  or bridgework may not be worn into surgery.   Bring small overnight bag day of surgery.    Patients discharged on the day of surgery will not be allowed to  drive home.  Special Instructions: Bring a copy of your healthcare power of attorney and living will documents         the day of surgery if you haven't scanned them before.   Please read over the following fact sheets you were given: IF YOU HAVE QUESTIONS ABOUT YOUR PRE-OP INSTRUCTIONS PLEASE CALL [facility] - Preparing for Surgery Before surgery, you can play an important role.  Because skin is not sterile, your skin needs to be as free of germs as possible.  You can reduce the number of germs on your skin by washing with CHG (chlorahexidine gluconate) soap before surgery.  CHG is an antiseptic cleaner which kills germs and bonds with the skin to continue killing germs even after washing. Please DO NOT use if you have an allergy to CHG or antibacterial soaps.  If your skin becomes reddened/irritated stop using the CHG and inform your nurse when you arrive at Short Stay. Do not shave (including legs and underarms) for at least 48 hours prior to the first CHG shower.  You may shave your face/neck.  Please follow these instructions carefully:  1.  Shower with CHG Soap the night before surgery and the  morning of surgery.  2.  If you choose to wash your hair, wash your hair first as usual with your normal  shampoo.  3.  After you shampoo, rinse your hair and body thoroughly to remove the shampoo.                             4.  Use CHG as you would any other liquid soap.  You can apply chg directly to the skin and wash.  Gently with a scrungie or clean washcloth.  5.  Apply the CHG Soap to your body ONLY FROM THE NECK DOWN.   Do   not use on face/ open                           Wound or open sores. Avoid contact with eyes, ears mouth and   genitals (private parts).                       Wash face,  Genitals (private parts) with your normal soap.             6.  Wash thoroughly, paying special attention to the area where your    surgery  will be performed.  7.  Thoroughly  rinse your body with warm water from the neck down.  8.  DO NOT shower/wash with your normal soap after using and rinsing off the CHG Soap.                9.  Pat yourself dry with a clean towel.            10.  Wear clean pajamas.            11.  Place clean sheets on your bed the night of your first shower and do not  sleep with pets. Day of Surgery : Do not apply any lotions/deodorants the morning of surgery.  Please wear clean clothes to  the hospital/surgery center.  FAILURE TO FOLLOW THESE INSTRUCTIONS MAY RESULT IN THE CANCELLATION OF YOUR SURGERY  PATIENT SIGNATURE_________________________________  NURSE SIGNATURE__________________________________  ________________________________________________________________________

## 2020-12-26 NOTE — Telephone Encounter (Signed)
Patient stated that he is going to wait on getting the Covid Booster until his wife gets her's.   Stated that he rather wait because he has a Procedure coming up 10/4.

## 2020-12-27 ENCOUNTER — Encounter (HOSPITAL_COMMUNITY): Payer: Self-pay

## 2020-12-27 ENCOUNTER — Encounter (HOSPITAL_COMMUNITY)
Admission: RE | Admit: 2020-12-27 | Discharge: 2020-12-27 | Disposition: A | Discharge status: Home / Self Care | Payer: Medicare Other | Source: Ambulatory Visit | Attending: Urology | Admitting: Urology

## 2020-12-27 ENCOUNTER — Other Ambulatory Visit: Payer: Self-pay

## 2020-12-27 DIAGNOSIS — Z01812 Encounter for preprocedural laboratory examination: Secondary | ICD-10-CM | POA: Diagnosis present

## 2020-12-27 HISTORY — DX: Malignant neoplasm of prostate: C61

## 2020-12-27 HISTORY — DX: Anxiety disorder, unspecified: F41.9

## 2020-12-27 HISTORY — DX: Pneumonia, unspecified organism: J18.9

## 2020-12-27 HISTORY — DX: Depression, unspecified: F32.A

## 2020-12-27 LAB — CBC
HCT: 41.2 % (ref 39.0–52.0)
Hemoglobin: 14 g/dL (ref 13.0–17.0)
MCH: 34.1 pg — ABNORMAL HIGH (ref 26.0–34.0)
MCHC: 34 g/dL (ref 30.0–36.0)
MCV: 100.2 fL — ABNORMAL HIGH (ref 80.0–100.0)
Platelets: 212 10*3/uL (ref 150–400)
RBC: 4.11 MIL/uL — ABNORMAL LOW (ref 4.22–5.81)
RDW: 13 % (ref 11.5–15.5)
WBC: 6.5 10*3/uL (ref 4.0–10.5)
nRBC: 0 % (ref 0.0–0.2)

## 2021-01-07 NOTE — H&P (Signed)
I have blood in my urine.  HPI: Lee Oconnell is a 87 year-old male established patient who is here for blood in the urine.  He did see the blood in his urine. He has seen blood clots.   He does not have a burning sensation when he urinates. He is not currently having trouble urinating.   He has not recently had unwanted weight loss.   12/17/20: Lee Oconnell returns today to complete his hematuria w/u. The CT showed left renal cysts, an enlarged prostate in intravesical protrusion and a large posterior diverticulum that is not fully characterized. The diverticulum is long standing and similar in size to the findings on a CT in 2005. He continues to have small amounts of blood in the urine. His IPSS is 9 with nocturia x 2.   09/12/2020: The patient notes passing a moderate size clot about 2-3 weeks ago but has since seen no additional hematuria. Prior to this, for about 3 months he was intermittently passing what he describes as small clot or tissue material which would occur intermittently with no correlating change in baseline lower urinary tract symptoms, pain or discomfort suggestive of obstructive uropathy, absence of systemic symptoms suggestive of underlying infectious process. His voiding symptoms grossly remain stable outside of the passage of hematuria. IPSS today is 8. He does have a known history of microscopic hematuria and gross hematuria thought to be largely related to his past history of radiation treatment creating what was previously described as a friable prostate. UA today contains dipstick positive blood but no obvious microscopic hematuria or other concerning findings suggestive of an infection.     CC: AUA Questions Scoring.  HPI:     AUA Symptom Score: He never has the sensation of not emptying his bladder completely after finishing urinating. Less than 20% of the time he has to urinate again fewer than two hours after he has finished urinating. 50% of the time he has to start and  stop again several times when he urinates. He never finds it difficult to postpone urination. 50% of the time he has a weak urinary stream. He never has to push or strain to begin urination. He has to get up to urinate 2 times from the time he goes to bed until the time he gets up in the morning.   Calculated AUA Symptom Score: 9    ALLERGIES: No Allergies    MEDICATIONS: Prozac 10 mg capsule  Trazodone Hcl 50 mg tablet  Vitamin D3     GU PSH: Locm 300-399Mg/Ml Iodine,1Ml - 09/21/2020       PSH Notes: Cataract Surgery, Inguinal Hernia Repair, Tonsillectomy, Appendectomy, Knee Arthroscopy, Cholecystectomy, Radiation Therapy, Inguinal Hernia Repair   NON-GU PSH: Appendectomy - 1951 Carpal tunnel surgery, Bilateral - 2020 Cholecystectomy (open) - 2009 Remove Tonsils - 1958     GU PMH: Microscopic hematuria - 09/21/2020, (Improving), His UA is unremarkable today. , - 2018 Gross hematuria - 09/12/2020, Gross Hematuria, - 2014 History of prostate cancer - 09/12/2020, PSA is stable. F/U in 1 year with a PSA. , - 12/02/2019 (Stable), His PSA is down. , - 2020 (Stable), He has a stable PSA and exam with stable moderate LUT's. I will have him return in a year with a PSA., - 2019, History of prostate cancer, - 2017 Weak Urinary Stream - 09/12/2020, (Stable), - 2019 BPH w/LUTS, Benign prostatic hyperplasia with urinary obstruction - 2017 ED due to arterial insufficiency, Erectile dysfunction due to arterial insufficiency - 2017 Other  microscopic hematuria, Microscopic hematuria - 2017      PMH Notes:  2006-04-21 10:39:59 - Note: Anxiety  2006-04-21 10:39:59 - Note: Arthritis   NON-GU PMH: Encounter for general adult medical examination without abnormal findings, Encounter for preventive health examination - 2017 Personal history of other diseases of the musculoskeletal system and connective tissue, History of low back pain - 2014    FAMILY HISTORY: Arthritis - Mother Death In The Family Father  - Runs In Family, Father Family Health Status Number - Runs In Family Hearing Loss - Mother Hypertension - Mother leukemia - Runs In Family Stroke Syndrome - Father   SOCIAL HISTORY: Marital Status: Married Preferred Language: English; Race: White Current Smoking Status: Patient has never smoked.   Tobacco Use Assessment Completed: Used Tobacco in last 30 days? Types of alcohol consumed: Wine.  Patient's occupation is/was retired.     Notes: Never A Smoker, Alcohol Use, Occupation:, Caffeine Use, Marital History - Currently Married 2 sons 1 daughter. His wife has been diagnosed with multiple myeloma. She has had pathologic fractures. She has marked incontinence and is using a Purewick.    REVIEW OF SYSTEMS:    GU Review Male:   Patient reports get up at night to urinate. Patient denies frequent urination, hard to postpone urination, burning/ pain with urination, leakage of urine, stream starts and stops, trouble starting your stream, have to strain to urinate , erection problems, and penile pain.  Gastrointestinal (Upper):   Patient denies nausea, vomiting, and indigestion/ heartburn.  Gastrointestinal (Lower):   Patient denies diarrhea and constipation.  Constitutional:   Patient denies fever, night sweats, weight loss, and fatigue.  Skin:   Patient denies skin rash/ lesion and itching.  Eyes:   Patient denies blurred vision and double vision.  Ears/ Nose/ Throat:   Patient denies sore throat and sinus problems.  Hematologic/Lymphatic:   Patient denies swollen glands and easy bruising.  Cardiovascular:   Patient denies leg swelling and chest pains.  Respiratory:   Patient denies cough and shortness of breath.  Endocrine:   Patient denies excessive thirst.  Musculoskeletal:   Patient denies back pain and joint pain.  Neurological:   Patient denies headaches and dizziness.  Psychologic:   Patient denies depression and anxiety.   Notes: blood in urine    VITAL SIGNS: None    Complexity of Data:  Records Review:   AUA Symptom Score, Previous Patient Records  Urine Test Review:   Urinalysis  X-Ray Review: C.T. Hematuria: Reviewed Films. Reviewed Report. Discussed With Patient.     11/25/19 11/02/18 07/28/17 07/28/16 07/24/15 06/29/14 06/29/13 06/23/12  PSA  Total PSA 0.66 ng/mL 0.59 ng/mL 0.83 ng/mL 0.64 ng/dl 0.81  0.78  0.79  0.74     PROCEDURES:         Flexible Cystoscopy - 52000  Risks, benefits, and some of the potential complications of the procedure were discussed. 35m of 2% lidocaine jelly was instilled intraurethrally.  Cipro 5041mgiven for antibiotic prophylaxis.     Meatus:  Normal size. Normal location. Normal condition.  Urethra:  No strictures.  External Sphincter:  Normal.  Verumontanum:  Normal.  Prostate:  Obstructing. with trilobar hyperplasia with blanching from prior radiation and a mid prostatic adhesion with a posterior false passage.   Bladder Neck:  Non-obstructing.  Ureteral Orifices:  Normal location. Normal size. Normal shape. Effluxed clear urine.  Bladder:  Mild trabeculation. There is a 52m11mapillary lesion on the right lateral wall and a diverticulum  on the posterior wall that has necrotic tissue suggestive of tumor fronds with erythema at the base. Otherwise Normal mucosa. No stones.      The procedure was well tolerated and there were no complications.         Urinalysis w/Scope Dipstick Dipstick Cont'd Micro  Color: Yellow Bilirubin: Neg mg/dL WBC/hpf: NS (Not Seen)  Appearance: Clear Ketones: Neg mg/dL RBC/hpf: 3 - 10/hpf  Specific Gravity: 1.020 Blood: 1+ ery/uL Bacteria: NS (Not Seen)  pH: 6.0 Protein: Neg mg/dL Cystals: NS (Not Seen)  Glucose: Neg mg/dL Urobilinogen: 0.2 mg/dL Casts: NS (Not Seen)    Nitrites: Neg Trichomonas: Not Present    Leukocyte Esterase: Neg leu/uL Mucous: Not Present      Epithelial Cells: 0 - 5/hpf      Yeast: NS (Not Seen)      Sperm: Not Present    ASSESSMENT:      ICD-10  Details  1 GU:   Gross hematuria - R31.0 Minor - He has bladder tumors of the lateral wall and posterior diverticulum. I am going to get him scheduled for cystoscopy with TURBT and possible gemcitabine instillation but it is unlikely that I will be able to resect all of the diverticular tumor and he will probably need adjuvant therapy with BCG or intravesical chemotherapy. I have reviewed the risks of bleeding, infection, bladder wall injury, need for a catheter, need for prostatic resection to deal with the adhesions, urethral stricture, chemical cystitis, thrombotic events and anesthetic complications.   2   History of prostate cancer - Z85.46 Chronic, Stable - PSA today.   3   Bladder Diverticulum - N32.3 Chronic, Worsening - His diverticulum has enlarged on CT and the apparent tumor is visible on the CT and appears to be on the inferior and anterior walls primarily and will be difficult to fully resect particularly with the thin wall of the diverticulum.   4   Bladder Cancer Lateral - C67.2 Undiagnosed New Problem  5   Bladder Cancer Posterior - C67.4 Undiagnosed New Problem     PLAN:            Medications Stop Meds: Trazodone Hcl  Discontinue: 12/17/2020  - Reason: The medication cycle was completed.            Orders Labs Urine Cytology, PSA          Schedule Labs: 1 Year - PSA  Return Visit/Planned Activity: 1 Year - Office Visit          Document Letter(s):  Created for Patient: Clinical Summary         Notes:   CC: Dr. Kathryne Eriksson.         Next Appointment:      Next Appointment: 12/11/2021 11:15 AM    Appointment Type: Laboratory Appointment    Location: [facility] [phone]    Provider: Lab LAB    Reason for Visit: annual psa

## 2021-01-08 ENCOUNTER — Encounter (HOSPITAL_COMMUNITY)
Admission: RE | Disposition: A | Discharge status: Home / Self Care | Payer: Self-pay | Source: Home / Self Care | Attending: Urology

## 2021-01-08 ENCOUNTER — Encounter (HOSPITAL_COMMUNITY): Payer: Self-pay | Admitting: Urology

## 2021-01-08 ENCOUNTER — Other Ambulatory Visit: Payer: Self-pay

## 2021-01-08 ENCOUNTER — Ambulatory Visit (HOSPITAL_COMMUNITY): Payer: Medicare Other | Admitting: Certified Registered Nurse Anesthetist

## 2021-01-08 ENCOUNTER — Observation Stay (HOSPITAL_COMMUNITY)
Admission: RE | Admit: 2021-01-08 | Discharge: 2021-01-09 | Disposition: A | Discharge status: Home / Self Care | Payer: Medicare Other | Attending: Urology | Admitting: Urology

## 2021-01-08 DIAGNOSIS — N323 Diverticulum of bladder: Secondary | ICD-10-CM | POA: Insufficient documentation

## 2021-01-08 DIAGNOSIS — C670 Malignant neoplasm of trigone of bladder: Secondary | ICD-10-CM | POA: Diagnosis not present

## 2021-01-08 DIAGNOSIS — D414 Neoplasm of uncertain behavior of bladder: Secondary | ICD-10-CM | POA: Diagnosis present

## 2021-01-08 DIAGNOSIS — C679 Malignant neoplasm of bladder, unspecified: Secondary | ICD-10-CM | POA: Diagnosis present

## 2021-01-08 DIAGNOSIS — Z8546 Personal history of malignant neoplasm of prostate: Secondary | ICD-10-CM | POA: Diagnosis not present

## 2021-01-08 HISTORY — PX: TRANSURETHRAL RESECTION OF BLADDER TUMOR WITH MITOMYCIN-C: SHX6459

## 2021-01-08 SURGERY — TRANSURETHRAL RESECTION OF BLADDER TUMOR WITH MITOMYCIN-C
Anesthesia: General | Site: Bladder

## 2021-01-08 MED ORDER — LIDOCAINE HCL (PF) 2 % IJ SOLN
INTRAMUSCULAR | Status: AC
  Filled 2021-01-08: qty 5

## 2021-01-08 MED ORDER — LACTATED RINGERS IV SOLN: INTRAVENOUS | Status: DC

## 2021-01-08 MED ORDER — CEFAZOLIN SODIUM-DEXTROSE 2-4 GM/100ML-% IV SOLN
2.0000 g | INTRAVENOUS | Status: AC
  Administered 2021-01-08: 2 g via INTRAVENOUS
  Filled 2021-01-08: qty 100

## 2021-01-08 MED ORDER — ALPRAZOLAM 0.25 MG PO TABS: 0.2500 mg | ORAL_TABLET | Freq: Every evening | ORAL | Status: DC | PRN

## 2021-01-08 MED ORDER — SUGAMMADEX SODIUM 200 MG/2ML IV SOLN
INTRAVENOUS | Status: DC | PRN
  Administered 2021-01-08: 200 mg via INTRAVENOUS

## 2021-01-08 MED ORDER — PROPOFOL 10 MG/ML IV BOLUS
INTRAVENOUS | Status: AC
  Filled 2021-01-08: qty 20

## 2021-01-08 MED ORDER — POTASSIUM CHLORIDE IN NACL 20-0.45 MEQ/L-% IV SOLN
INTRAVENOUS | Status: DC
  Filled 2021-01-08 (×4): qty 1000

## 2021-01-08 MED ORDER — DEXAMETHASONE SODIUM PHOSPHATE 10 MG/ML IJ SOLN
INTRAMUSCULAR | Status: DC | PRN
  Administered 2021-01-08: 4 mg via INTRAVENOUS

## 2021-01-08 MED ORDER — HYDROMORPHONE HCL 1 MG/ML IJ SOLN: 0.5000 mg | INTRAMUSCULAR | Status: DC | PRN

## 2021-01-08 MED ORDER — SODIUM CHLORIDE 0.9 % IR SOLN
Status: DC | PRN
  Administered 2021-01-08: 12000 mL

## 2021-01-08 MED ORDER — DEXAMETHASONE SODIUM PHOSPHATE 10 MG/ML IJ SOLN
INTRAMUSCULAR | Status: AC
  Filled 2021-01-08: qty 1

## 2021-01-08 MED ORDER — PROPOFOL 10 MG/ML IV BOLUS
INTRAVENOUS | Status: DC | PRN
  Administered 2021-01-08: 150 mg via INTRAVENOUS
  Administered 2021-01-08: 20 mg via INTRAVENOUS

## 2021-01-08 MED ORDER — ONDANSETRON HCL 4 MG/2ML IJ SOLN: 4.0000 mg | INTRAMUSCULAR | Status: DC | PRN

## 2021-01-08 MED ORDER — DROPERIDOL 2.5 MG/ML IJ SOLN: 0.6250 mg | Freq: Once | INTRAMUSCULAR | Status: DC | PRN

## 2021-01-08 MED ORDER — ORAL CARE MOUTH RINSE: 15.0000 mL | Freq: Once | OROMUCOSAL | Status: AC

## 2021-01-08 MED ORDER — FENTANYL CITRATE (PF) 100 MCG/2ML IJ SOLN
INTRAMUSCULAR | Status: DC | PRN
  Administered 2021-01-08 (×2): 50 ug via INTRAVENOUS

## 2021-01-08 MED ORDER — LIDOCAINE 2% (20 MG/ML) 5 ML SYRINGE
INTRAMUSCULAR | Status: DC | PRN
  Administered 2021-01-08: 100 mg via INTRAVENOUS

## 2021-01-08 MED ORDER — CHLORHEXIDINE GLUCONATE 0.12 % MT SOLN
15.0000 mL | Freq: Once | OROMUCOSAL | Status: AC
  Administered 2021-01-08: 15 mL via OROMUCOSAL

## 2021-01-08 MED ORDER — FLUOXETINE HCL 10 MG PO CAPS
10.0000 mg | ORAL_CAPSULE | Freq: Every morning | ORAL | Status: DC
  Administered 2021-01-08: 10 mg via ORAL
  Filled 2021-01-08 (×2): qty 1

## 2021-01-08 MED ORDER — BISACODYL 10 MG RE SUPP: 10.0000 mg | Freq: Every day | RECTAL | Status: DC | PRN

## 2021-01-08 MED ORDER — FENTANYL CITRATE (PF) 100 MCG/2ML IJ SOLN
INTRAMUSCULAR | Status: AC
  Filled 2021-01-08: qty 2

## 2021-01-08 MED ORDER — 0.9 % SODIUM CHLORIDE (POUR BTL) OPTIME
TOPICAL | Status: DC | PRN
  Administered 2021-01-08: 1000 mL

## 2021-01-08 MED ORDER — HYOSCYAMINE SULFATE 0.125 MG SL SUBL
0.1250 mg | SUBLINGUAL_TABLET | SUBLINGUAL | Status: DC | PRN
  Filled 2021-01-08: qty 1

## 2021-01-08 MED ORDER — ONDANSETRON HCL 4 MG/2ML IJ SOLN
INTRAMUSCULAR | Status: DC | PRN
  Administered 2021-01-08: 4 mg via INTRAVENOUS

## 2021-01-08 MED ORDER — SENNOSIDES-DOCUSATE SODIUM 8.6-50 MG PO TABS
1.0000 | ORAL_TABLET | Freq: Every evening | ORAL | Status: DC | PRN
  Administered 2021-01-08: 1 via ORAL
  Filled 2021-01-08: qty 1

## 2021-01-08 MED ORDER — FLEET ENEMA 7-19 GM/118ML RE ENEM
1.0000 | ENEMA | Freq: Once | RECTAL | Status: DC | PRN
Start: 2021-01-08 — End: 2021-01-09

## 2021-01-08 MED ORDER — ONDANSETRON HCL 4 MG/2ML IJ SOLN
INTRAMUSCULAR | Status: AC
  Filled 2021-01-08: qty 2

## 2021-01-08 MED ORDER — TRAZODONE HCL 50 MG PO TABS
50.0000 mg | ORAL_TABLET | Freq: Every evening | ORAL | Status: DC | PRN
  Administered 2021-01-08: 50 mg via ORAL
  Filled 2021-01-08: qty 1

## 2021-01-08 MED ORDER — ACETAMINOPHEN 325 MG PO TABS
650.0000 mg | ORAL_TABLET | ORAL | Status: DC | PRN
Start: 2021-01-08 — End: 2021-01-09

## 2021-01-08 MED ORDER — OXYCODONE HCL 5 MG PO TABS
5.0000 mg | ORAL_TABLET | ORAL | Status: DC | PRN
Start: 2021-01-08 — End: 2021-01-09

## 2021-01-08 MED ORDER — FENTANYL CITRATE PF 50 MCG/ML IJ SOSY: 25.0000 ug | PREFILLED_SYRINGE | INTRAMUSCULAR | Status: DC | PRN

## 2021-01-08 MED ORDER — ROCURONIUM BROMIDE 10 MG/ML (PF) SYRINGE
PREFILLED_SYRINGE | INTRAVENOUS | Status: DC | PRN
  Administered 2021-01-08: 50 mg via INTRAVENOUS

## 2021-01-08 SURGICAL SUPPLY — 20 items
BAG DRN RND TRDRP ANRFLXCHMBR (UROLOGICAL SUPPLIES)
BAG URINE DRAIN 2000ML AR STRL (UROLOGICAL SUPPLIES) IMPLANT
BAG URO CATCHER STRL LF (MISCELLANEOUS) ×2 IMPLANT
CATH FOLEY 3WAY 30CC 22FR (CATHETERS) IMPLANT
CATH URET 5FR 28IN OPEN ENDED (CATHETERS) IMPLANT
DRAPE FOOT SWITCH (DRAPES) ×2 IMPLANT
GLOVE SURG POLYISO LF SZ8 (GLOVE) IMPLANT
GOWN STRL REUS W/TWL XL LVL3 (GOWN DISPOSABLE) ×2 IMPLANT
GUIDEWIRE STR DUAL SENSOR (WIRE) ×1 IMPLANT
HOLDER FOLEY CATH W/STRAP (MISCELLANEOUS) IMPLANT
KIT TURNOVER KIT A (KITS) ×2 IMPLANT
LOOP CUT BIPOLAR 24F LRG (ELECTROSURGICAL) IMPLANT
MANIFOLD NEPTUNE II (INSTRUMENTS) ×2 IMPLANT
PACK CYSTO (CUSTOM PROCEDURE TRAY) ×2 IMPLANT
SUT ETHILON 3 0 PS 1 (SUTURE) IMPLANT
SYR 30ML LL (SYRINGE) IMPLANT
SYR TOOMEY IRRIG 70ML (MISCELLANEOUS)
SYRINGE TOOMEY IRRIG 70ML (MISCELLANEOUS) IMPLANT
TUBING CONNECTING 10 (TUBING) ×2 IMPLANT
TUBING UROLOGY SET (TUBING) ×2 IMPLANT

## 2021-01-08 NOTE — Anesthesia Postprocedure Evaluation (Signed)
Anesthesia Post Note  Patient: Lee Oconnell  Procedure(s) Performed: TRANSURETHRAL RESECTION OF BLADDER TUMOR WITH GEMCITABINE (Bladder)     Patient location during evaluation: PACU Anesthesia Type: General Level of consciousness: sedated Pain management: pain level controlled Vital Signs Assessment: post-procedure vital signs reviewed and stable Respiratory status: spontaneous breathing and respiratory function stable Cardiovascular status: stable Postop Assessment: no apparent nausea or vomiting Anesthetic complications: no   No notable events documented.  Last Vitals:  Vitals:   01/08/21 1345 01/08/21 1400  BP: (!) 168/73 (!) 167/82  Pulse: 60 71  Resp: 11 10  Temp:    SpO2: 97% 96%    Last Pain:  Vitals:   01/08/21 1400  TempSrc:   PainSc: 0-No pain                 Arleta Ostrum DANIEL

## 2021-01-08 NOTE — Discharge Instructions (Signed)
You may remove the catheter in the morning by cutting off the yellow side arm and letting the fluid drain.  The catheter should just slide out.  If you don't feel comfortable taking it out, call the office to arrange removal.

## 2021-01-08 NOTE — Transfer of Care (Signed)
Immediate Anesthesia Transfer of Care Note  Patient: Lee Oconnell  Procedure(s) Performed: TRANSURETHRAL RESECTION OF BLADDER TUMOR WITH GEMCITABINE (Bladder)  Patient Location: PACU  Anesthesia Type:General  Level of Consciousness: awake  Airway & Oxygen Therapy: Patient Spontanous Breathing and Patient connected to face mask oxygen  Post-op Assessment: Report given to RN and Post -op Vital signs reviewed and stable  Post vital signs: Reviewed and stable  Last Vitals:  Vitals Value Taken Time  BP 141/92 01/08/21 1320  Temp    Pulse 63 01/08/21 1324  Resp 9 01/08/21 1324  SpO2 100 % 01/08/21 1324  Vitals shown include unvalidated device data.  Last Pain:  Vitals:   01/08/21 0942  TempSrc:   PainSc: 0-No pain         Complications: No notable events documented.

## 2021-01-08 NOTE — Interval H&P Note (Signed)
History and Physical Interval Note:  01/08/2021 11:37 AM  Lee Oconnell  has presented today for surgery, with the diagnosis of BLADDER TUMOR.  The various methods of treatment have been discussed with the patient and family. After consideration of risks, benefits and other options for treatment, the patient has consented to  Procedure(s): TRANSURETHRAL RESECTION OF BLADDER TUMOR WITH GEMCITABINE (N/A) as a surgical intervention.  The patient's history has been reviewed, patient examined, no change in status, stable for surgery.  I have reviewed the patient's chart and labs.  Questions were answered to the patient's satisfaction.     Irine Seal

## 2021-01-08 NOTE — Anesthesia Preprocedure Evaluation (Addendum)
Anesthesia Evaluation  Patient identified by MRN, date of birth, ID band Patient awake    Reviewed: Allergy & Precautions, NPO status , Patient's Chart, lab work & pertinent test results  Airway Mallampati: II  TM Distance: >3 FB Neck ROM: Full    Dental no notable dental hx. (+) Dental Advisory Given, Teeth Intact   Pulmonary pneumonia,    Pulmonary exam normal breath sounds clear to auscultation       Cardiovascular negative cardio ROS Normal cardiovascular exam Rhythm:Regular Rate:Normal     Neuro/Psych PSYCHIATRIC DISORDERS Anxiety Depression  Neuromuscular disease    GI/Hepatic negative GI ROS, Neg liver ROS,   Endo/Other  negative endocrine ROS  Renal/GU negative Renal ROS     Musculoskeletal  (+) Arthritis ,   Abdominal   Peds  Hematology negative hematology ROS (+)   Anesthesia Other Findings   Reproductive/Obstetrics                            Anesthesia Physical Anesthesia Plan  ASA: 2  Anesthesia Plan: General   Post-op Pain Management:    Induction: Intravenous  PONV Risk Score and Plan: 3 and Ondansetron, Dexamethasone, Treatment may vary due to age or medical condition and Diphenhydramine  Airway Management Planned: LMA  Additional Equipment: None  Intra-op Plan:   Post-operative Plan: Extubation in OR  Informed Consent: I have reviewed the patients History and Physical, chart, labs and discussed the procedure including the risks, benefits and alternatives for the proposed anesthesia with the patient or authorized representative who has indicated his/her understanding and acceptance.     Dental advisory given  Plan Discussed with: CRNA  Anesthesia Plan Comments:        Anesthesia Quick Evaluation

## 2021-01-08 NOTE — Anesthesia Procedure Notes (Signed)
Procedure Name: Intubation Date/Time: 01/08/2021 12:12 PM Performed by: Milford Cage, CRNA Pre-anesthesia Checklist: Patient identified, Emergency Drugs available, Suction available and Patient being monitored Patient Re-evaluated:Patient Re-evaluated prior to induction Oxygen Delivery Method: Circle system utilized Preoxygenation: Pre-oxygenation with 100% oxygen Induction Type: IV induction Ventilation: Mask ventilation without difficulty Laryngoscope Size: Miller and 2 Grade View: Grade I Tube type: Oral Tube size: 7.5 mm Number of attempts: 1 Airway Equipment and Method: Stylet Placement Confirmation: ETT inserted through vocal cords under direct vision, positive ETCO2 and breath sounds checked- equal and bilateral Secured at: 23 cm Tube secured with: Tape Dental Injury: Teeth and Oropharynx as per pre-operative assessment

## 2021-01-09 ENCOUNTER — Encounter (HOSPITAL_COMMUNITY): Payer: Self-pay | Admitting: Urology

## 2021-01-09 DIAGNOSIS — N323 Diverticulum of bladder: Secondary | ICD-10-CM | POA: Diagnosis present

## 2021-01-09 DIAGNOSIS — C670 Malignant neoplasm of trigone of bladder: Secondary | ICD-10-CM | POA: Diagnosis not present

## 2021-01-09 LAB — SURGICAL PATHOLOGY

## 2021-01-09 MED ORDER — CHLORHEXIDINE GLUCONATE CLOTH 2 % EX PADS: 6.0000 | MEDICATED_PAD | Freq: Every day | CUTANEOUS | Status: DC

## 2021-01-09 NOTE — Progress Notes (Signed)
Pt has voided spontaneously twice (each approximately 286ml of pink/red urine) since foley removal this AM. Pt has tolerated his diet without nausea or vomiting and had one BM this morning. He has ambulated in the hall with minimal assistance and remains without any significant pain. Vital signs have remained stable and patient is ready for discharge home from nursing standpoint.

## 2021-01-09 NOTE — Op Note (Signed)
Procedure: Cystoscopy with transurethral resection of bladder tumor of the trigone and posterior wall diverticulum, 2 to 5 cm.  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  Postop diagnosis: Same.  Surgeon: Dr. Irine Seal.  Anesthesia: General.  Specimen: Tumor chips from the trigone tumor, debris from the diverticular lesion and resected tissue from the diverticular lesion.  Drain: 4 [place] Foley catheter.  EBL: None.  Complications: None.  Indications: The patient is an 87 year old male who was recently evaluated for hematuria and was found to have a 6 mm papillary lesion of the trigone and an indeterminate lesion with papillary appearing fronds in the posterior wall diverticulum but the fronds appeared somewhat necrotic and possibly consistent with keratin.  It was felt that resection was indicated.  He was also noted to have some prostatic adhesions from his prior radiation therapy and resection.  Procedure: He was given antibiotic.  He was taken operating room where general anesthetic was induced.  He was placed in lithotomy position and fitted with PAS hose.  His perineum and genitalia were prepped with Betadine solution he was draped in usual sterile fashion.  Cystoscopy was performed using a 21 [place] scope and 30 degree lens.  Examination revealed a normal urethra.  There was a mild membranous stricture consistent with radiation induced stricture.  The prostatic urethra had some mid prostatic adhesions that were broken down.  The prostatic urethra was patent with prior resection and radiation changes with some neovascularity but no papillary lesions.  There was a small middle lobe.  Examination of bladder demonstrated mild trabeculation.  There was a approximately 1-1/2 to 2 cm diverticulum on the right posterior wall and inferior medial to that was a second diverticulum with necrotic appearing fronds within the lumen.   Additionally there was a papillary tumor in the mid trigone with some surrounding abnormal mucosa that was relatively adjacent to the diverticulum.  The ureteral orifices were unremarkable.  Urethra was then calibrated to 30 [place] with [employer] sounds and a 26 French continuous-flow resectoscope sheath was inserted.  This was fitted with an Beatrix Fetters handle with a bipolar loop and a 30 degree lens.  Saline was used as the irrigant.  The trigonal lesion was initially resected and the surrounding abnormal mucosa was generously fulgurated.  This lesion had a diameter approximately 1.5 cm.  The specimen was retrieved to be sent separately.  I then inspected the interior of the diverticulum and scraped out a large amount of this necrotic, keratinized appearing tissue.  No obvious tumor mass was identified, but there were some areas of erythema particularly on the inferior medial margin of the lesion near the lip of the diverticulum.  Once the bulk of the necrotic tissue was removed and collected to be sent as a separate specimen, I did some resection of the margins of the lesion at the lip where it appeared most likely that diagnostic tissue could be obtained.  Other areas were then fulgurated to try to destroy the abnormal mucosa.  The total diameter of the lesion was 3 x 4 cm.  The specimen was also collected separately for pathology.  At this point final inspection revealed no active bleeding and no significant retained tissue.  The resectoscope was removed and a 20 French Foley catheter was inserted.  The balloon was filled with 10 mL of sterile fluid and the cath was placed to straight drainage.  He was taken down from lithotomy position, his  anesthetic was reversed and he was moved recovery in stable condition.  There were no complications.  I did not feel gemcitabine was indicated as I felt it would worsen the changes in the diverticulum with the altered healing process.

## 2021-01-09 NOTE — Plan of Care (Signed)

## 2021-01-09 NOTE — Discharge Summary (Signed)
Physician Discharge Summary  Patient ID: Lee Oconnell MRN: [id number] DOB/AGE: 1933/06/30 87 y.o.  Admit date: 01/08/2021 Discharge date: 01/09/2021  Admission Diagnoses:  Bladder cancer [facility])  Discharge Diagnoses:  Principal Problem:   Bladder cancer [facility]) Active Problems:   Bladder diverticulum   Past Medical History:  Diagnosis Date   Anxiety    Arthritis    Cancer ([place])    Depression    Pneumonia    Prostate cancer ([place])     Surgeries: Procedure(s): TRANSURETHRAL RESECTION OF BLADDER TUMOR on 01/08/2021   Consultants (if any):   Discharged Condition: Improved  Hospital Course: Lee Oconnell is an 87 y.o. male who was admitted 01/08/2021 with a diagnosis of Bladder cancer ([place]) and went to the operating room on 01/08/2021 and underwent the above named procedures.  He had a papillary tumor in of the trigone and necrotic material in a posterior diverticulum consistent with keratin without obvious tumor mass.  Foley out this morning.  Home when voiding.   He was given perioperative antibiotics:  Anti-infectives (From admission, onward)    Start     Dose/Rate Route Frequency Ordered Stop   01/08/21 0916  ceFAZolin (ANCEF) IVPB 2g/100 mL premix        2 g 200 mL/hr over 30 Minutes Intravenous 30 min pre-op 01/08/21 0916 01/08/21 1249     .  He was given sequential compression devices for DVT prophylaxis.  He benefited maximally from the hospital stay and there were no complications.    Recent vital signs:  Vitals:   01/09/21 0119 01/09/21 0519  BP: 127/83 117/65  Pulse: 85 68  Resp: 16 18  Temp: (!) 97.3 F (36.3 C) 98.4 F (36.9 C)  SpO2: 96% 96%    Recent laboratory studies:  Lab Results  Component Value Date   HGB 14.0 12/27/2020   HGB 14.6 11/21/2020   HGB 14.7 12/02/2019   Lab Results  Component Value Date   WBC 6.5 12/27/2020   PLT 212 12/27/2020   No results found for: INR Lab Results  Component Value Date   NA 141 11/21/2020   K 4.1  11/21/2020   CL 107 11/21/2020   CO2 24 11/21/2020   BUN 20 11/21/2020   CREATININE 0.89 11/21/2020   GLUCOSE 77 11/21/2020    Discharge Medications:   Allergies as of 01/09/2021       Reactions   Prednisone Anxiety        Medication List     TAKE these medications    ALPRAZolam 0.25 MG tablet Commonly known as: XANAX Take 0.25 mg by mouth at bedtime as needed for anxiety.   FLUoxetine 10 MG capsule Commonly known as: PROZAC Take 10 mg by mouth every morning.   traZODone 50 MG tablet Commonly known as: DESYREL Take 50-100 mg by mouth at bedtime as needed for sleep.   Vitamin D 50 MCG (2000 UT) tablet Take 1 tablet (2,000 Units total) by mouth daily.        Diagnostic Studies: No results found.  Disposition: Discharge disposition: 01-Home or Self Care      Discharge Instructions     Discontinue IV   Complete by: As directed    Foley catheter - discontinue   Complete by: As directed         Follow-up Information     Irine Seal, MD Follow up on 01/21/2021.   Specialty: Urology Why: [address] information: [place] [address] [phone]  Signed: Irine Seal 01/09/2021, 6:45 AM

## 2021-01-22 ENCOUNTER — Ambulatory Visit: Payer: Medicare Other | Attending: Internal Medicine

## 2021-01-22 ENCOUNTER — Other Ambulatory Visit (HOSPITAL_BASED_OUTPATIENT_CLINIC_OR_DEPARTMENT_OTHER): Payer: Self-pay

## 2021-01-22 DIAGNOSIS — Z23 Encounter for immunization: Secondary | ICD-10-CM

## 2021-01-22 MED ORDER — MODERNA COVID-19 BIVAL BOOSTER 50 MCG/0.5ML IM SUSP
INTRAMUSCULAR | 0 refills | Status: DC
  Filled 2021-01-22: qty 0.5, 1d supply, fill #0

## 2021-01-22 MED ORDER — INFLUENZA VAC A&B SA ADJ QUAD 0.5 ML IM PRSY
PREFILLED_SYRINGE | INTRAMUSCULAR | 0 refills | Status: DC
  Filled 2021-01-22: qty 0.5, 1d supply, fill #0

## 2021-01-22 NOTE — Progress Notes (Signed)
   Covid-19 Vaccination Clinic  Name:  Lee Oconnell    MRN: [id number] DOB: [birth year]  01/22/2021  Mr. Lee Oconnell was observed post Covid-19 immunization for 15 minutes without incident. He was provided with Vaccine Information Sheet and instruction to access the V-Safe system.   Mr. Lee Oconnell was instructed to call 911 with any severe reactions post vaccine: Difficulty breathing  Swelling of face and throat  A fast heartbeat  A bad rash all over body  Dizziness and weakness

## 2021-02-01 ENCOUNTER — Ambulatory Visit (INDEPENDENT_AMBULATORY_CARE_PROVIDER_SITE_OTHER): Payer: Medicare Other

## 2021-02-01 ENCOUNTER — Other Ambulatory Visit: Payer: Self-pay

## 2021-02-01 DIAGNOSIS — Z23 Encounter for immunization: Secondary | ICD-10-CM | POA: Diagnosis not present

## 2021-05-10 ENCOUNTER — Encounter (HOSPITAL_BASED_OUTPATIENT_CLINIC_OR_DEPARTMENT_OTHER): Payer: Self-pay | Admitting: *Deleted

## 2021-05-10 ENCOUNTER — Other Ambulatory Visit: Payer: Self-pay

## 2021-05-10 ENCOUNTER — Emergency Department (HOSPITAL_BASED_OUTPATIENT_CLINIC_OR_DEPARTMENT_OTHER)
Admission: EM | Admit: 2021-05-10 | Discharge: 2021-05-10 | Disposition: A | Discharge status: Home / Self Care | Payer: Medicare Other | Attending: Emergency Medicine | Admitting: Emergency Medicine

## 2021-05-10 DIAGNOSIS — R338 Other retention of urine: Secondary | ICD-10-CM

## 2021-05-10 DIAGNOSIS — R339 Retention of urine, unspecified: Secondary | ICD-10-CM | POA: Diagnosis present

## 2021-05-10 DIAGNOSIS — R319 Hematuria, unspecified: Secondary | ICD-10-CM | POA: Insufficient documentation

## 2021-05-10 MED ORDER — LIDOCAINE HCL URETHRAL/MUCOSAL 2 % EX GEL
1.0000 "application " | Freq: Once | CUTANEOUS | Status: DC | PRN
  Filled 2021-05-10: qty 30

## 2021-05-10 NOTE — ED Notes (Signed)
Leg bag applied to left leg. Pt and family educated on catheter maintenance. Pt and family verbalized and demionstrated understanding and have no further questions at this time.

## 2021-05-10 NOTE — ED Notes (Signed)
Foley cathter inserted for good return with some blood in urine.

## 2021-05-10 NOTE — ED Triage Notes (Signed)
Pt is undergoing treatment for bladder cancer and had BCG instilled in his bladder around 1pm and was told to retain it for 2 hours and then void.  Pt has done this successfully 4 times before but this treatment pt is experiencing inability to void.  Pt denies pain but states that "it does not feel good".  [facility] advised pt to come here

## 2021-05-10 NOTE — ED Provider Notes (Signed)
[place] EMERGENCY DEPT Provider Note   CSN: [id number] Arrival date & time: 05/10/21  1717     History  Chief Complaint  Patient presents with   Urinary Retention    Lee Oconnell is a 88 y.o. male.  HPI 88 year old male presents with urinary retention.  History is from patient and son at the bedside.  He had a BCG treatment earlier today but then when he got home he could not urinate like he was supposed to.  Thus he has not urinated since around noon.  Came in here with urinary retention symptoms which were relieved when the nurse placed a Foley catheter.  Since the Foley catheter has been placed there has been a small amount of pink urine coming out.  He did not notice any hematuria before.  This as the patient concerned because he has been told that if there is blood in the urine they are not allowed to place this medicine and he is worried about getting systemic.  Otherwise he feels well.  Complete relief of his symptoms with the catheter.  Home Medications Prior to Admission medications   Medication Sig Start Date End Date Taking? Authorizing Provider  ALPRAZolam Duanne Moron) 0.25 MG tablet Take 0.25 mg by mouth at bedtime as needed for anxiety.    [provider]  Cholecalciferol (VITAMIN D) 50 MCG (2000 UT) tablet Take 1 tablet (2,000 Units total) by mouth daily. 12/06/19   Virgie Dad, MD  COVID-19 mRNA bivalent vaccine, Moderna, (MODERNA COVID-19 BIVAL BOOSTER) 50 MCG/0.5ML injection Inject into the muscle. 01/22/21   Carlyle Basques, MD  FLUoxetine (PROZAC) 10 MG capsule Take 10 mg by mouth every morning. 12/17/20   [provider]  influenza vaccine adjuvanted (FLUAD) 0.5 ML injection Inject into the muscle. 01/22/21   Carlyle Basques, MD  traZODone (DESYREL) 50 MG tablet Take 50-100 mg by mouth at bedtime as needed for sleep. 11/15/20   [provider]      Allergies    Prednisone    Review of Systems   Review of Systems   Genitourinary:  Positive for difficulty urinating and hematuria. Negative for dysuria.   Physical Exam Updated Vital Signs BP (!) 168/93    Pulse 76    Temp 98 F (36.7 C) (Oral)    Resp 16    Wt 82.6 kg    SpO2 100%    BMI 24.68 kg/m  Physical Exam Vitals and nursing note reviewed.  Constitutional:      General: He is not in acute distress.    Appearance: He is well-developed. He is not ill-appearing or diaphoretic.  HENT:     Head: Normocephalic and atraumatic.  Pulmonary:     Effort: Pulmonary effort is normal.  Abdominal:     General: There is no distension.     Palpations: Abdomen is soft.     Tenderness: There is no abdominal tenderness.  Genitourinary:    Comments: Penis appears normal with a catheter now in place. No scrotal swelling Skin:    General: Skin is warm and dry.  Neurological:     Mental Status: He is alert.    ED Results / Procedures / Treatments   Labs (all labs ordered are listed, but only abnormal results are displayed) Labs Reviewed - No data to display   EKG None  Radiology No results found.  Procedures Procedures    Medications Ordered in ED Medications  lidocaine (XYLOCAINE) 2 % jelly 1 application (has no administration  in time range)    ED Course/ Medical Decision Making/ A&P                            Patient's retention symptoms resolved after Foley catheter placed by nurse.  Now patient's biggest concern is that he has been getting the BCG treatment and now has some hematuria and is worried about systemic toxicity.  I did discuss briefly with Dr. Junious Silk of urology over the phone.  He notes that he could do some antibiotics but the risk is fairly low.  Patient is comfortable with this and would like to go home.  Otherwise, he had no preceding symptoms I think UTI is highly unlikely and urine is not needed to be tested.  He will be discharged home with return precautions.        Final Clinical Impression(s) / ED  Diagnoses Final diagnoses:  Acute urinary retention    Rx / DC Orders ED Discharge Orders     None         Sherwood Gambler, MD 05/11/21 [id number]

## 2021-05-10 NOTE — Discharge Instructions (Addendum)
Call the urology office on Monday to set up an appointment this upcoming week to have the catheter removed and a voiding trial.

## 2021-05-15 ENCOUNTER — Non-Acute Institutional Stay: Payer: Medicare Other | Admitting: Internal Medicine

## 2021-05-15 ENCOUNTER — Other Ambulatory Visit: Payer: Self-pay

## 2021-05-15 ENCOUNTER — Encounter: Payer: Self-pay | Admitting: Internal Medicine

## 2021-05-15 VITALS — BP 127/76 | HR 96 | Temp 97.6°F | Ht 72.0 in | Wt 179.9 lb

## 2021-05-15 DIAGNOSIS — S46811A Strain of other muscles, fascia and tendons at shoulder and upper arm level, right arm, initial encounter: Secondary | ICD-10-CM | POA: Diagnosis not present

## 2021-05-15 DIAGNOSIS — C679 Malignant neoplasm of bladder, unspecified: Secondary | ICD-10-CM | POA: Diagnosis not present

## 2021-05-15 DIAGNOSIS — K580 Irritable bowel syndrome with diarrhea: Secondary | ICD-10-CM

## 2021-05-15 DIAGNOSIS — R252 Cramp and spasm: Secondary | ICD-10-CM | POA: Diagnosis not present

## 2021-05-15 DIAGNOSIS — G8929 Other chronic pain: Secondary | ICD-10-CM

## 2021-05-15 DIAGNOSIS — F339 Major depressive disorder, recurrent, unspecified: Secondary | ICD-10-CM

## 2021-05-15 DIAGNOSIS — F419 Anxiety disorder, unspecified: Secondary | ICD-10-CM

## 2021-05-15 DIAGNOSIS — M545 Low back pain, unspecified: Secondary | ICD-10-CM

## 2021-05-15 DIAGNOSIS — E782 Mixed hyperlipidemia: Secondary | ICD-10-CM

## 2021-05-15 DIAGNOSIS — Z636 Dependent relative needing care at home: Secondary | ICD-10-CM

## 2021-05-15 MED ORDER — METHOCARBAMOL 500 MG PO TABS: 250.0000 mg | ORAL_TABLET | Freq: Every evening | ORAL | 0 refills | Status: DC | PRN

## 2021-05-15 MED ORDER — MELOXICAM 15 MG PO TABS: 15.0000 mg | ORAL_TABLET | Freq: Every day | ORAL | 0 refills | Status: DC

## 2021-05-15 NOTE — Progress Notes (Signed)
Location:  [place]  Clinic (12)  Provider: Virgie Dad   Code Status:  Goals of Care:  Advanced Directives 05/15/2021  Does Patient Have a Medical Advance Directive? No  Type of Advance Directive -  Does patient want to make changes to medical advance directive? -  Copy of [place] in Chart? -  Would patient like information on creating a medical advance directive? No - Patient declined     Chief Complaint  Patient presents with   Medical Management of Chronic Issues    Patient here today for his 6 month follow up.    Quality Metric Gaps    TDAP Patient will call with #5 covid vaccine date.     HPI: Patient is a 88 y.o. male seen today for medical management of chronic diseases.     Sensorineural hearing loss left ear Follows with ENT in [facility] IBS symptoms, chronic low back pain, Prominent metatarsal planning for surgery with podiatry  Active issue Pain in right Upper arm area Denies injury Fall No Heavy lifting No Recent Vaccinations Can move his Shoulder well Depression Sees Psychiatrist Lexapro working  Zoloft and Prozac did not help made dizzy Hardly taking Xanax Trazadone also Rare Leg Cramps Recently especially when he goes to sleep at night Non Invasive Bladder cancer With Hematuria on BCG treatment one more treatment left  Caregiver stress Wife has Cognition issues and Multiple Myeloma  Past Medical History:  Diagnosis Date   Anxiety    Arthritis    Cancer ([place])    Depression    Pneumonia    Prostate cancer [facility])     Past Surgical History:  Procedure Laterality Date   APPENDECTOMY  1951   CHOLECYSTECTOMY     2006   [place]  2005   [place], ADENOIDECTOMY, BILATERAL MYRINGOTOMY AND TUBES  1957   trans-urethral resection of prostate  1999, 2003   TRANSURETHRAL RESECTION OF BLADDER TUMOR WITH MITOMYCIN-C  N/A 01/08/2021   Procedure: TRANSURETHRAL RESECTION OF BLADDER TUMOR WITH GEMCITABINE;  Surgeon: Irine Seal, MD;  Location: [facility];  Service: Urology;  Laterality: N/A;   WRIST SURGERY  April and July 2021    Allergies  Allergen Reactions   Prednisone Anxiety    Outpatient Encounter Medications as of 05/15/2021  Medication Sig   ALPRAZolam (XANAX) 0.25 MG tablet Take 0.25 mg by mouth at bedtime as needed for anxiety.   Cholecalciferol (VITAMIN D) 50 MCG (2000 UT) tablet Take 1 tablet (2,000 Units total) by mouth daily.   traZODone (DESYREL) 50 MG tablet Take 50-100 mg by mouth at bedtime as needed for sleep.   COVID-19 mRNA bivalent vaccine, Moderna, (MODERNA COVID-19 BIVAL BOOSTER) 50 MCG/0.5ML injection Inject into the muscle.   influenza vaccine adjuvanted (FLUAD) 0.5 ML injection Inject into the muscle.   [DISCONTINUED] FLUoxetine (PROZAC) 10 MG capsule Take 10 mg by mouth every morning.   Facility-Administered Encounter Medications as of 05/15/2021  Medication   gemcitabine (GEMZAR) chemo syringe for bladder instillation 2,000 mg    Review of Systems:  Review of Systems  Constitutional:  Negative for activity change, appetite change and unexpected weight change.  HENT: Negative.    Respiratory:  Negative for cough and shortness of breath.   Cardiovascular:  Negative for leg swelling.  Gastrointestinal:  Negative for constipation.  Genitourinary:  Positive for hematuria and  urgency. Negative for frequency.  Musculoskeletal:  Positive for arthralgias and neck pain. Negative for gait problem and myalgias.  Skin: Negative.  Negative for rash.  Neurological:  Negative for dizziness and weakness.  Psychiatric/Behavioral:  Negative for confusion and sleep disturbance.   All other systems reviewed and are negative.  Health Maintenance  Topic Date Due   TETANUS/TDAP  08/07/2015   COVID-19 Vaccine (4 - Booster) 04/02/2021   Pneumonia Vaccine 18+ Years old  Completed   INFLUENZA  VACCINE  Completed   Zoster Vaccines- Shingrix  Completed   HPV VACCINES  Aged Out    Physical Exam: Vitals:   05/15/21 1447  BP: 127/76  Pulse: 96  Temp: 97.6 F (36.4 C)  SpO2: 98%  Weight: 179 lb 14.4 oz (81.6 kg)  Height: 6' (1.829 m)   Body mass index is 24.4 kg/m. Physical Exam Vitals reviewed.  Constitutional:      Appearance: Normal appearance.  HENT:     Head: Normocephalic.     Mouth/Throat:     Mouth: Mucous membranes are moist.     Pharynx: Oropharynx is clear.  Eyes:     Pupils: Pupils are equal, round, and reactive to light.  Cardiovascular:     Rate and Rhythm: Normal rate and regular rhythm.     Pulses: Normal pulses.     Heart sounds: No murmur heard. Pulmonary:     Effort: Pulmonary effort is normal. No respiratory distress.     Breath sounds: Normal breath sounds. No rales.  Abdominal:     General: Abdomen is flat. Bowel sounds are normal.     Palpations: Abdomen is soft.  Musculoskeletal:        General: No swelling.     Cervical back: Neck supple.     Comments: Pain in Lower part of his Deltoid Muscle seems in Contraction No Pain in Shoulder.   Skin:    General: Skin is warm.  Neurological:     General: No focal deficit present.     Mental Status: He is alert and oriented to person, place, and time.  Psychiatric:        Mood and Affect: Mood normal.        Thought Content: Thought content normal.    Labs reviewed: Basic Metabolic Panel: Recent Labs    11/21/20 0853  NA 141  K 4.1  CL 107  CO2 24  GLUCOSE 77  BUN 20  CREATININE 0.89  CALCIUM 9.3  TSH 0.90   Liver Function Tests: Recent Labs    11/21/20 0853  AST 16  ALT 12  BILITOT 1.1  PROT 6.9   No results for input(s): LIPASE, AMYLASE in the last 8760 hours. No results for input(s): AMMONIA in the last 8760 hours. CBC: Recent Labs    11/21/20 0853 12/27/20 1134  WBC 5.7 6.5  NEUTROABS 3,380  --   HGB 14.6 14.0  HCT 42.9 41.2  MCV 99.8 100.2*  PLT 220 212    Lipid Panel: Recent Labs    11/21/20 0853  CHOL 183  HDL 63  LDLCALC 102*  TRIG 85  CHOLHDL 2.9   No results found for: HGBA1C  Procedures since last visit: No results found.  Assessment/Plan 1. Strain of right deltoid muscle, initial encounter Will try Robaxin and Meloxicam for 1 week Avoid heavy lifting - TSH; Future - Magnesium; Future  2. Muscle cramps Showed him some exercises he can do for stretching - Lipid panel; Future - COMPLETE METABOLIC  PANEL WITH GFR; Future - CBC with Differential/Platelet; Future - TSH; Future - Magnesium; Future  3. Malignant neoplasm of urinary bladder, unspecified site [facility]) One treatment left Following with Urology  4. Depression, recurrent ([place]) On Lexapro now Doing well  - COMPLETE METABOLIC PANEL WITH GFR; Future - CBC with Differential/Platelet; Future  5. Anxiety Xanax PRN Not using it   6. Irritable bowel syndrome with diarrhea   7. Mixed hyperlipidemia  - Lipid panel; Future - COMPLETE METABOLIC PANEL WITH GFR; Future - CBC with Differential/Platelet; Future  8. Chronic bilateral low back pain without sciatica   9. Caregiver stress     Labs/tests ordered:  * No order type specified *  Virgie Dad, MD

## 2021-05-15 NOTE — Patient Instructions (Addendum)
Call and speak with Lovena Le at [facility] about billing.   Call office a report date of covid vaccine at [facility].  Take Meloxicam 15 mg with Food in the morning for 1 week And Robaxin 250 mg at night This is Muscle relaxant Can use heating pad to help pain

## 2021-05-23 ENCOUNTER — Other Ambulatory Visit: Payer: Self-pay

## 2021-05-23 DIAGNOSIS — E782 Mixed hyperlipidemia: Secondary | ICD-10-CM

## 2021-05-23 DIAGNOSIS — F339 Major depressive disorder, recurrent, unspecified: Secondary | ICD-10-CM

## 2021-05-23 DIAGNOSIS — S46811A Strain of other muscles, fascia and tendons at shoulder and upper arm level, right arm, initial encounter: Secondary | ICD-10-CM

## 2021-05-23 DIAGNOSIS — R252 Cramp and spasm: Secondary | ICD-10-CM

## 2021-05-24 LAB — COMPLETE METABOLIC PANEL WITH GFR
AG Ratio: 1.3 (calc) (ref 1.0–2.5)
ALT: 27 U/L (ref 9–46)
AST: 25 U/L (ref 10–35)
Albumin: 3.7 g/dL (ref 3.6–5.1)
Alkaline phosphatase (APISO): 57 U/L (ref 35–144)
BUN: 21 mg/dL (ref 7–25)
CO2: 22 mmol/L (ref 20–32)
Calcium: 8.6 mg/dL (ref 8.6–10.3)
Chloride: 106 mmol/L (ref 98–110)
Creat: 0.87 mg/dL (ref 0.70–1.22)
Globulin: 2.8 g/dL (calc) (ref 1.9–3.7)
Glucose, Bld: 124 mg/dL — ABNORMAL HIGH (ref 65–99)
Potassium: 3.9 mmol/L (ref 3.5–5.3)
Sodium: 137 mmol/L (ref 135–146)
Total Bilirubin: 0.8 mg/dL (ref 0.2–1.2)
Total Protein: 6.5 g/dL (ref 6.1–8.1)
eGFR: 85 mL/min/{1.73_m2} (ref >=60)

## 2021-05-24 LAB — LIPID PANEL
Cholesterol: 175 mg/dL (ref <200)
HDL: 53 mg/dL (ref >=40)
LDL Cholesterol (Calc): 105 mg/dL (calc) — ABNORMAL HIGH
Non-HDL Cholesterol (Calc): 122 mg/dL (calc) (ref <130)
Total CHOL/HDL Ratio: 3.3 (calc) (ref <5.0)
Triglycerides: 84 mg/dL (ref <150)

## 2021-05-24 LAB — MAGNESIUM: Magnesium: 2.1 mg/dL (ref 1.5–2.5)

## 2021-05-24 LAB — CBC WITH DIFFERENTIAL/PLATELET
Absolute Monocytes: 577 cells/uL (ref 200–950)
Basophils Absolute: 50 cells/uL (ref 0–200)
Basophils Relative: 0.8 %
Eosinophils Absolute: 143 cells/uL (ref 15–500)
Eosinophils Relative: 2.3 %
HCT: 39.1 % (ref 38.5–50.0)
Hemoglobin: 13.2 g/dL (ref 13.2–17.1)
Lymphs Abs: 1352 cells/uL (ref 850–3900)
MCH: 33.2 pg — ABNORMAL HIGH (ref 27.0–33.0)
MCHC: 33.8 g/dL (ref 32.0–36.0)
MCV: 98.2 fL (ref 80.0–100.0)
MPV: 9.5 fL (ref 7.5–12.5)
Monocytes Relative: 9.3 %
Neutro Abs: 4080 cells/uL (ref 1500–7800)
Neutrophils Relative %: 65.8 %
Platelets: 253 10*3/uL (ref 140–400)
RBC: 3.98 10*6/uL — ABNORMAL LOW (ref 4.20–5.80)
RDW: 12.1 % (ref 11.0–15.0)
Total Lymphocyte: 21.8 %
WBC: 6.2 10*3/uL (ref 3.8–10.8)

## 2021-05-24 LAB — TSH: TSH: 1.27 mIU/L (ref 0.40–4.50)

## 2021-06-18 ENCOUNTER — Telehealth: Payer: Self-pay | Admitting: *Deleted

## 2021-06-18 DIAGNOSIS — H9012 Conductive hearing loss, unilateral, left ear, with unrestricted hearing on the contralateral side: Secondary | ICD-10-CM

## 2021-06-18 NOTE — Telephone Encounter (Signed)
Patient has an appointment for Audiology for routine screening and needs a referral in order for insurance to cover.  ? ?Pended Referral and sent to Dr. Lyndel Safe for approval.  ?

## 2021-06-26 ENCOUNTER — Other Ambulatory Visit: Payer: Self-pay

## 2021-06-26 ENCOUNTER — Encounter: Payer: Self-pay | Admitting: Internal Medicine

## 2021-06-26 ENCOUNTER — Non-Acute Institutional Stay: Payer: Medicare Other | Admitting: Internal Medicine

## 2021-06-26 VITALS — BP 145/76 | HR 83 | Temp 97.7°F | Ht 72.0 in | Wt 177.7 lb

## 2021-06-26 DIAGNOSIS — E782 Mixed hyperlipidemia: Secondary | ICD-10-CM | POA: Diagnosis not present

## 2021-06-26 DIAGNOSIS — K580 Irritable bowel syndrome with diarrhea: Secondary | ICD-10-CM

## 2021-06-26 DIAGNOSIS — G8929 Other chronic pain: Secondary | ICD-10-CM

## 2021-06-26 DIAGNOSIS — C679 Malignant neoplasm of bladder, unspecified: Secondary | ICD-10-CM

## 2021-06-26 DIAGNOSIS — F339 Major depressive disorder, recurrent, unspecified: Secondary | ICD-10-CM | POA: Diagnosis not present

## 2021-06-26 DIAGNOSIS — M545 Low back pain, unspecified: Secondary | ICD-10-CM

## 2021-06-26 DIAGNOSIS — H905 Unspecified sensorineural hearing loss: Secondary | ICD-10-CM

## 2021-06-26 DIAGNOSIS — M79621 Pain in right upper arm: Secondary | ICD-10-CM

## 2021-06-26 DIAGNOSIS — Z636 Dependent relative needing care at home: Secondary | ICD-10-CM

## 2021-06-26 NOTE — Progress Notes (Signed)
? ?Location:  [facility] ?  ?Place of Service:  Clinic (12) ? ?Provider:  ? ?Code Status:  ?Goals of Care:  ? ?  06/26/2021  ?  3:59 PM  ?Advanced Directives  ?Does Patient Have a Medical Advance Directive? No  ?Would patient like information on creating a medical advance directive? No - Patient declined  ? ? ? ?Chief Complaint  ?Patient presents with  ? Medical Management of Chronic Issues  ? Quality Metric Gaps  ?  TDAP due  ? ? ?HPI: Patient is a 88 y.o. male seen today for medical management of chronic diseases.   ? ?Active issue ?Pain in right Upper arm area ?Says Meloxicam and Robaxin did not help ?His son inlaw has told him he has pinch nerve and showed him some exercise which he is going to try ?Depression ?Possible increase in dose of Lexapro per Psych ?Hearing loss ?Want to know if he should wear hearing aids ?Non Invasive Bladder cancer ?Finished his BCG treatment but now has Dysuria and Frequency and Urgency ?Caregiver stress ?Taking care of his Wife ?Past Medical History:  ?Diagnosis Date  ? Anxiety   ? Arthritis   ? Cancer [facility])   ? Depression   ? Pneumonia   ? Prostate cancer ([place])   ? ? ?Past Surgical History:  ?Procedure Laterality Date  ? APPENDECTOMY  1951  ? CHOLECYSTECTOMY    ? 2006  ? HERNIA REPAIR  1951  ? INSERTION PROSTATE RADIATION SEED  2005  ? MENISCUS REPAIR  1995  ? TONSILECTOMY, ADENOIDECTOMY, BILATERAL MYRINGOTOMY AND TUBES  1957  ? trans-urethral resection of prostate  1999, 2003  ? TRANSURETHRAL RESECTION OF BLADDER TUMOR WITH MITOMYCIN-C N/A 01/08/2021  ? Procedure: TRANSURETHRAL RESECTION OF BLADDER TUMOR WITH GEMCITABINE;  Surgeon: Irine Seal, MD;  Location: [facility];  Service: Urology;  Laterality: N/A;  ? WRIST SURGERY  April and July 2021  ? ? ?Allergies  ?Allergen Reactions  ? Prednisone Anxiety  ? ? ?Outpatient Encounter Medications as of 06/26/2021  ?Medication Sig  ? Cholecalciferol (VITAMIN D) 50 MCG (2000 UT) tablet Take 1 tablet (2,000 Units total) by mouth daily.   ? escitalopram (LEXAPRO) 10 MG tablet Take 10 mg by mouth daily.  ? traZODone (DESYREL) 50 MG tablet Take 50-100 mg by mouth at bedtime as needed for sleep.  ? [DISCONTINUED] ALPRAZolam (XANAX) 0.25 MG tablet Take 0.25 mg by mouth at bedtime as needed for anxiety.  ? [DISCONTINUED] COVID-19 mRNA bivalent vaccine, Moderna, (MODERNA COVID-19 BIVAL BOOSTER) 50 MCG/0.5ML injection Inject into the muscle.  ? [DISCONTINUED] influenza vaccine adjuvanted (FLUAD) 0.5 ML injection Inject into the muscle.  ? [DISCONTINUED] meloxicam (MOBIC) 15 MG tablet Take 1 tablet (15 mg total) by mouth daily.  ? [DISCONTINUED] methocarbamol (ROBAXIN) 500 MG tablet Take 0.5 tablets (250 mg total) by mouth at bedtime as needed for muscle spasms.  ? ?Facility-Administered Encounter Medications as of 06/26/2021  ?Medication  ? gemcitabine (GEMZAR) chemo syringe for bladder instillation 2,000 mg  ? ? ?Review of Systems:  ?Review of Systems  ?Constitutional:  Negative for activity change, appetite change and unexpected weight change.  ?HENT: Negative.    ?Respiratory:  Negative for cough and shortness of breath.   ?Cardiovascular:  Negative for leg swelling.  ?Gastrointestinal:  Negative for constipation.  ?Genitourinary:  Positive for frequency, hematuria and urgency.  ?Musculoskeletal:  Positive for myalgias. Negative for arthralgias and gait problem.  ?Skin: Negative.  Negative for rash.  ?Neurological:  Negative for  dizziness and weakness.  ?Psychiatric/Behavioral:  Negative for confusion and sleep disturbance.   ?All other systems reviewed and are negative. ? ?Health Maintenance  ?Topic Date Due  ? TETANUS/TDAP  08/07/2015  ? COVID-19 Vaccine (4 - Booster) 02/25/2022 (Originally 04/02/2021)  ? Pneumonia Vaccine 15+ Years old  Completed  ? INFLUENZA VACCINE  Completed  ? Zoster Vaccines- Shingrix  Completed  ? HPV VACCINES  Aged Out  ? ? ?Physical Exam: ?Vitals:  ? 06/26/21 1556  ?BP: (!) 145/76  ?Pulse: 83  ?Temp: 97.7 ?F (36.5 ?C)  ?SpO2:  97%  ?Weight: 177 lb 11.2 oz (80.6 kg)  ?Height: 6' (1.829 m)  ? ?Body mass index is 24.1 kg/m?Marland Kitchen ?Physical Exam ?Vitals reviewed.  ?Constitutional:   ?   Appearance: Normal appearance.  ?HENT:  ?   Head: Normocephalic.  ?   Mouth/Throat:  ?   Mouth: Mucous membranes are moist.  ?   Pharynx: Oropharynx is clear.  ?Eyes:  ?   Pupils: Pupils are equal, round, and reactive to light.  ?Cardiovascular:  ?   Rate and Rhythm: Normal rate and regular rhythm.  ?   Pulses: Normal pulses.  ?   Heart sounds: No murmur heard. ?Pulmonary:  ?   Effort: Pulmonary effort is normal. No respiratory distress.  ?   Breath sounds: Normal breath sounds. No rales.  ?Abdominal:  ?   General: Abdomen is flat. Bowel sounds are normal.  ?   Palpations: Abdomen is soft.  ?Musculoskeletal:     ?   General: No swelling.  ?   Cervical back: Neck supple.  ?Skin: ?   General: Skin is warm.  ?Neurological:  ?   General: No focal deficit present.  ?   Mental Status: He is alert and oriented to person, place, and time.  ?Psychiatric:     ?   Mood and Affect: Mood normal.     ?   Thought Content: Thought content normal.  ? ? ?Labs reviewed: ?Basic Metabolic Panel: ?Recent Labs  ?  11/21/20 ?0853 05/23/21 ?4917  ?NA 141 137  ?K 4.1 3.9  ?CL 107 106  ?CO2 24 22  ?GLUCOSE 77 124*  ?BUN 20 21  ?CREATININE 0.89 0.87  ?CALCIUM 9.3 8.6  ?MG  --  2.1  ?TSH 0.90 1.27  ? ?Liver Function Tests: ?Recent Labs  ?  11/21/20 ?0853 05/23/21 ?0750  ?AST 16 25  ?ALT 12 27  ?BILITOT 1.1 0.8  ?PROT 6.9 6.5  ? ?No results for input(s): LIPASE, AMYLASE in the last 8760 hours. ?No results for input(s): AMMONIA in the last 8760 hours. ?CBC: ?Recent Labs  ?  11/21/20 ?0853 12/27/20 ?1134 05/23/21 ?0750  ?WBC 5.7 6.5 6.2  ?NEUTROABS 3,380  --  4,080  ?HGB 14.6 14.0 13.2  ?HCT 42.9 41.2 39.1  ?MCV 99.8 100.2* 98.2  ?PLT 220 212 253  ? ?Lipid Panel: ?Recent Labs  ?  11/21/20 ?9150 05/23/21 ?5697  ?CHOL 183 175  ?HDL 63 53  ?LDLCALC 102* 105*  ?TRIG 85 84  ?CHOLHDL 2.9 3.3  ? ?No  results found for: HGBA1C ? ?Procedures since last visit: ?No results found. ? ?Assessment/Plan ?1. Pain in right upper arm ?Does not want any therapy right now ? ?2. Depression, recurrent ([place]) ?ON Lexapro Doing well ?Follows with Psych ?Uses Trazodone and Xanax PRn ?3. Irritable bowel syndrome with diarrhea ? ? ?4. Mixed hyperlipidemia ?Diet and Exercise ?Low risk ? ?5. Chronic bilateral low back pain without sciatica ? ? ?  6. Caregiver stress ? ? ?7. Malignant neoplasm of urinary bladder, unspecified site [facility]) ?Follows with Urology ?8. Sensorineural hearing loss (SNHL) of left ear, unspecified hearing status on contralateral side ?Refuses to see ENT right now ? ? ? ?Labs/tests ordered:  * No order type specified * ?Next appt:  Visit date not found ? ? ? ? ?

## 2021-09-09 ENCOUNTER — Telehealth: Payer: Self-pay

## 2021-09-09 NOTE — Telephone Encounter (Signed)
Left voicemail for patient to call the office back to schedule a Medicare AWV with Windell Moulding, NP at [facility] clinic.

## 2021-09-10 NOTE — Telephone Encounter (Signed)
Scheduled Medicare AWV at [facility] on 6/12 with Yvonna Alanis, NP

## 2021-09-16 ENCOUNTER — Non-Acute Institutional Stay: Payer: Medicare Other | Admitting: Orthopedic Surgery

## 2021-09-16 ENCOUNTER — Encounter: Payer: Self-pay | Admitting: Orthopedic Surgery

## 2021-09-16 VITALS — BP 118/62 | HR 69 | Temp 98.2°F | Ht 72.0 in | Wt 183.1 lb

## 2021-09-16 DIAGNOSIS — Z0000 Encounter for general adult medical examination without abnormal findings: Secondary | ICD-10-CM

## 2021-09-16 NOTE — Patient Instructions (Signed)
  Lee Oconnell , Thank you for taking time to come for your Medicare Wellness Visit. I appreciate your ongoing commitment to your health goals. Please review the following plan we discussed and let me know if I can assist you in the future.   These are the goals we discussed:  Goals      Maintain Mobility and Function     Evidence-based guidance:  Acknowledge and validate impact of pain, loss of strength and potential disfigurement (hand osteoarthritis) on mental health and daily life, such as social isolation, anxiety, depression, impaired sexual relationship and   injury from falls.  Anticipate referral to physical or occupational therapy for assessment, therapeutic exercise and recommendation for adaptive equipment or assistive devices; encourage participation.  Assess impact on ability to perform activities of daily living, as well as engage in sports and leisure events or requirements of work or school.  Provide anticipatory guidance and reassurance about the benefit of exercise to maintain function; acknowledge and normalize fear that exercise may worsen symptoms.  Encourage regular exercise, at least 10 minutes at a time for 45 minutes per week; consider yoga, water exercise and proprioceptive exercises; encourage use of wearable activity tracker to increase motivation and adherence.  Encourage maintenance or resumption of daily activities, including employment, as pain allows and with minimal exposure to trauma.  Assist patient to advocate for adaptations to the work environment.  Consider level of pain and function, gender, age, lifestyle, patient preference, quality of life, readiness and ?ocapacity to benefit? when recommending patients for orthopaedic surgery consultation.  Explore strategies, such as changes to medication regimen or activity that enables patient to anticipate and manage flare-ups that increase deconditioning and disability.  Explore patient preferences; encourage  exposure to a broader range of activities that have been avoided for fear of experiencing pain.  Identify barriers to participation in therapy or exercise, such as pain with activity, anticipated or imagined pain.  Monitor postoperative joint replacement or any preexisting joint replacement for ongoing pain and loss of function; provide social support and encouragement throughout recovery.   Notes:         This is a list of the screening recommended for you and due dates:  Health Maintenance  Topic Date Due   COVID-19 Vaccine (4 - Booster for Moderna series) 02/25/2022*   Flu Shot  11/05/2021   Tetanus Vaccine  01/26/2029   Pneumonia Vaccine  Completed   Zoster (Shingles) Vaccine  Completed   HPV Vaccine  Aged Out  *Topic was postponed. The date shown is not the original due date.

## 2021-09-16 NOTE — Progress Notes (Signed)
Subjective:   Lee Oconnell is a 88 y.o. male who presents for Medicare Annual/Subsequent preventive examination.  Place of Service[facility] [place] Provider: Windell Moulding, AGNP-C   Review of Systems           Objective:    Today's Vitals   09/16/21 1424  BP: 118/62  Pulse: 69  Temp: 98.2 F (36.8 C)  TempSrc: Temporal  SpO2: 95%  Weight: 183 lb 1.6 oz (83.1 kg)  Height: 6' (1.829 m)   Body mass index is 24.83 kg/m.     09/16/2021   10:19 AM 06/26/2021    3:59 PM 05/15/2021    2:50 PM 05/10/2021    5:29 PM 01/08/2021    5:10 PM 12/27/2020   11:02 AM 11/28/2020    1:41 PM  Advanced Directives  Does Patient Have a Medical Advance Directive? Yes No No No Yes Yes No  Type of Paramedic of [place];Living will    [place];Living will [place];Living will   Does patient want to make changes to medical advance directive? No - Patient declined    No - Patient declined    Copy of [place] in Chart? Yes - validated most recent copy scanned in chart (See row information)    No - copy requested No - copy requested   Would patient like information on creating a medical advance directive?  No - Patient declined No - Patient declined    No - Patient declined    Current Medications (verified) Outpatient Encounter Medications as of 09/16/2021  Medication Sig   Cholecalciferol (VITAMIN D) 50 MCG (2000 UT) tablet Take 1 tablet (2,000 Units total) by mouth daily.   escitalopram (LEXAPRO) 10 MG tablet Take 15 mg by mouth daily.   [DISCONTINUED] escitalopram (LEXAPRO) 10 MG tablet Take 15 mg by mouth daily.   [DISCONTINUED] traZODone (DESYREL) 50 MG tablet Take 50-100 mg by mouth at bedtime as needed for sleep.   Facility-Administered Encounter Medications as of 09/16/2021  Medication   gemcitabine (GEMZAR) chemo syringe for bladder instillation 2,000 mg    Allergies (verified) Prednisone   History: Past  Medical History:  Diagnosis Date   Anxiety    Arthritis    Cancer ([place])    Depression    Pneumonia    Prostate cancer [facility])    Past Surgical History:  Procedure Laterality Date   APPENDECTOMY  1951   CHOLECYSTECTOMY     2006   [place]  2005   [place], ADENOIDECTOMY, BILATERAL MYRINGOTOMY AND TUBES  1957   trans-urethral resection of prostate  1999, 2003   TRANSURETHRAL RESECTION OF BLADDER TUMOR WITH MITOMYCIN-C N/A 01/08/2021   Procedure: TRANSURETHRAL RESECTION OF BLADDER TUMOR WITH GEMCITABINE;  Surgeon: Irine Seal, MD;  Location: [facility];  Service: Urology;  Laterality: N/A;   WRIST SURGERY  April and July 2021   Family History  Problem Relation Age of Onset   Heart disease Mother    Stroke Father    Cancer Paternal Grandfather        leukemia   Social History   Socioeconomic History   Marital status: Married    Spouse name: Not on file   Number of children: Not on file   Years of education: Not on file   Highest education level: Not on file  Occupational History   Not on file  Tobacco Use   Smoking status: Never   Smokeless tobacco: Never  Vaping Use   Vaping Use: Never used  Substance and Sexual Activity   Alcohol use: Yes    Alcohol/week: 0.0 standard drinks of alcohol    Comment: one glass of wine daily   Drug use: No   Sexual activity: Not on file  Other Topics Concern   Not on file  Social History Narrative   Not on file   Social Determinants of Health   Financial Resource Strain: Low Risk  (09/16/2021)   Overall Financial Resource Strain (CARDIA)    Difficulty of Paying Living Expenses: Not hard at all  Food Insecurity: No Food Insecurity (09/16/2021)   Hunger Vital Sign    Worried About Running Out of Food in the Last Year: Never true    [place] in the Last Year: Never true  Transportation Needs: No Transportation Needs (09/16/2021)   PRAPARE - Armed forces logistics/support/administrative officer (Medical): No    Lack of Transportation (Non-Medical): No  Physical Activity: Insufficiently Active (09/16/2021)   Exercise Vital Sign    Days of Exercise per Week: 7 days    Minutes of Exercise per Session: 20 min  Stress: No Stress Concern Present (09/16/2021)   [place]    Feeling of Stress : Only a little  Social Connections: Moderately Isolated (09/16/2021)   Social Connection and Isolation Panel [NHANES]    Frequency of Communication with Friends and Family: More than three times a week    Frequency of Social Gatherings with Friends and Family: Never    Attends Religious Services: Never    Marine scientist or Organizations: No    Attends Music therapist: Never    Marital Status: Married    Tobacco Counseling Counseling given: Not Answered   Clinical Intake:  Pre-visit preparation completed: Yes  Pain : No/denies pain     BMI - recorded: 24.83 Nutritional Status: BMI of 19-24  Normal Nutritional Risks: None Diabetes: No  How often do you need to have someone help you when you read instructions, pamphlets, or other written materials from your doctor or pharmacy?: 1 - Never What is the last grade level you completed in school?: Doctorate degree  Diabetic?No  Interpreter Needed?: No      Activities of Daily Living    09/16/2021    2:52 PM 01/08/2021    5:10 PM  In your present state of health, do you have any difficulty performing the following activities:  Hearing?  1  Vision? 0 0  Difficulty concentrating or making decisions? 0 0  Walking or climbing stairs? 0 0  Dressing or bathing? 0 0  Doing errands, shopping? 0 0  Preparing Food and eating ? N   Using the Toilet? N   In the past six months, have you accidently leaked urine? Y   Do you have problems with loss of bowel control? N   Managing your Medications? N   Managing your Finances? N    Housekeeping or managing your Housekeeping? N     Patient Care Team: Virgie Dad, MD as PCP - General (Internal Medicine)  Indicate any recent Medical Services you may have received from other than [facility] providers in the past year (date may be approximate).     Assessment:   This is a routine wellness examination for Lee Oconnell.  Hearing/Vision screen Hearing Screening - Comments::  Patient has gotten ears checked in the last 12 months.  Vision Screening - Comments:: Patient has gotten eyes checked in the last 12 months.   Dietary issues and exercise activities discussed: Current Exercise Habits: The patient does not participate in regular exercise at present, Exercise limited by: psychological condition(s) (anxiety)   Goals Addressed             This Visit's Progress    Maintain Mobility and Function   On track    Evidence-based guidance:  Acknowledge and validate impact of pain, loss of strength and potential disfigurement (hand osteoarthritis) on mental health and daily life, such as social isolation, anxiety, depression, impaired sexual relationship and   injury from falls.  Anticipate referral to physical or occupational therapy for assessment, therapeutic exercise and recommendation for adaptive equipment or assistive devices; encourage participation.  Assess impact on ability to perform activities of daily living, as well as engage in sports and leisure events or requirements of work or school.  Provide anticipatory guidance and reassurance about the benefit of exercise to maintain function; acknowledge and normalize fear that exercise may worsen symptoms.  Encourage regular exercise, at least 10 minutes at a time for 45 minutes per week; consider yoga, water exercise and proprioceptive exercises; encourage use of wearable activity tracker to increase motivation and adherence.  Encourage maintenance or resumption of daily activities, including employment, as pain allows and  with minimal exposure to trauma.  Assist patient to advocate for adaptations to the work environment.  Consider level of pain and function, gender, age, lifestyle, patient preference, quality of life, readiness and ?ocapacity to benefit? when recommending patients for orthopaedic surgery consultation.  Explore strategies, such as changes to medication regimen or activity that enables patient to anticipate and manage flare-ups that increase deconditioning and disability.  Explore patient preferences; encourage exposure to a broader range of activities that have been avoided for fear of experiencing pain.  Identify barriers to participation in therapy or exercise, such as pain with activity, anticipated or imagined pain.  Monitor postoperative joint replacement or any preexisting joint replacement for ongoing pain and loss of function; provide social support and encouragement throughout recovery.   Notes:        Depression Screen    09/16/2021    2:28 PM  PHQ 2/9 Scores  PHQ - 2 Score 0    Fall Risk    09/16/2021    2:51 PM 09/16/2021    2:27 PM 06/26/2021    3:59 PM 05/15/2021    2:47 PM 11/28/2020    1:40 PM  [place] in the past year? 0 0 0 0 0  Number falls in past yr: 0 0 0 0 0  Injury with Fall? 0 0 0 0 0  Risk for fall due to : No Fall Risks No Fall Risks No Fall Risks No Fall Risks No Fall Risks  Follow up Falls evaluation completed;Education provided;Falls prevention discussed Falls evaluation completed Falls evaluation completed Falls evaluation completed Falls evaluation completed    FALL RISK PREVENTION PERTAINING TO THE HOME:  Any stairs in or around the home? No  If so, are there any without handrails? No  Home free of loose throw rugs in walkways, pet beds, electrical cords, etc? Yes  Adequate lighting in your home to reduce risk of falls? Yes   ASSISTIVE DEVICES UTILIZED TO PREVENT FALLS:  Life alert? No  Use of a cane, walker or w/c? No  Grab bars in  the bathroom? Yes  Shower chair or bench in shower? Yes  Elevated toilet seat or a handicapped toilet? Yes   TIMED UP AND GO:  Was the test performed? No .  Length of time to ambulate 10 feet: N/A sec.   Gait slow and steady without use of assistive device  Cognitive Function:    09/16/2021    2:29 PM  MMSE - Mini Mental State Exam  Orientation to time 5  Orientation to Place 5  Registration 3  Attention/ Calculation 5  Recall 2  Language- name 2 objects 2  Language- repeat 1  Language- follow 3 step command 3  Language- read & follow direction 1  Write a sentence 1  Copy design 0  Copy design-comments Completed clock  Total score 28        Immunizations Immunization History  Administered Date(s) Administered   Fluad Quad(high Dose 65+) 02/01/2021   Influenza, High Dose Seasonal PF 01/16/2016, 01/21/2017, 01/28/2018   Influenza-Unspecified 01/28/2018, 01/19/2019   Moderna Covid-19 Vaccine Bivalent Booster 49yr & up 01/22/2021   Moderna SARS-COV2 Booster Vaccination 02/20/2020, 09/12/2020   Moderna Sars-Covid-2 Vaccination 04/11/2019, 05/09/2019   Pneumococcal Conjugate-13 05/14/2016   Pneumococcal Polysaccharide-23 05/19/2012   Tdap 08/06/2005, 01/27/2019   Unspecified SARS-COV-2 Vaccination 02/05/2021   Zoster Recombinat (Shingrix) 09/10/2017, 12/19/2017   Zoster, Live 08/27/2005    TDAP status: Up to date  Flu Vaccine status: Up to date  Pneumococcal vaccine status: Up to date  Covid-19 vaccine status: Completed vaccines  Qualifies for Shingles Vaccine? Yes   Zostavax completed Yes   Shingrix Completed?: Yes  Screening Tests Health Maintenance  Topic Date Due   COVID-19 Vaccine (4 - Booster for Moderna series) 02/25/2022 (Originally 04/02/2021)   INFLUENZA VACCINE  11/05/2021   TETANUS/TDAP  01/26/2029   Pneumonia Vaccine 88 Years old  Completed   Zoster Vaccines- Shingrix  Completed   HPV VACCINES  Aged Out    Health Maintenance  There  are no preventive care reminders to display for this patient.  Colorectal cancer screening: No longer required.   Lung Cancer Screening: (Low Dose CT Chest recommended if Age 86-80years, 30 pack-year currently smoking OR have quit w/in 15years.) does not qualify.   Lung Cancer Screening Referral: No  Additional Screening:  Hepatitis C Screening: does not qualify; advanced age  Vision Screening: Recommended annual ophthalmology exams for early detection of glaucoma and other disorders of the eye. Is the patient up to date with their annual eye exam?  Yes  Who is the provider or what is the name of the office in which the patient attends annual eye exams? [facility] If pt is not established with a provider, would they like to be referred to a provider to establish care? No .   Dental Screening: Recommended annual dental exams for proper oral hygiene  Community Resource Referral / Chronic Care Management: CRR required this visit?  No   CCM required this visit?  No      Plan:     I have personally reviewed and noted the following in the patient's chart:   Medical and social history Use of alcohol, tobacco or illicit drugs  Current medications and supplements including opioid prescriptions. Patient is not currently taking opioid prescriptions. Functional ability and status Nutritional status Physical activity Advanced directives List of other physicians Hospitalizations, surgeries, and ER visits in previous 12 months Vitals Screenings to include cognitive, depression, and falls Referrals and appointments  In addition, I have reviewed  and discussed with patient certain preventive protocols, quality metrics, and best practice recommendations. A written personalized care plan for preventive services as well as general preventive health recommendations were provided to patient.     Yvonna Alanis, NP   09/16/2021

## 2021-12-11 ENCOUNTER — Encounter: Payer: Self-pay | Admitting: Internal Medicine

## 2021-12-11 ENCOUNTER — Non-Acute Institutional Stay: Payer: Medicare Other | Admitting: Internal Medicine

## 2021-12-11 ENCOUNTER — Telehealth: Payer: Self-pay

## 2021-12-11 ENCOUNTER — Other Ambulatory Visit: Payer: Self-pay

## 2021-12-11 VITALS — BP 136/60 | HR 95 | Temp 100.2°F | Ht 72.0 in | Wt 180.3 lb

## 2021-12-11 DIAGNOSIS — U071 COVID-19: Secondary | ICD-10-CM | POA: Diagnosis not present

## 2021-12-11 MED ORDER — NIRMATRELVIR/RITONAVIR (PAXLOVID)TABLET: 3.0000 | ORAL_TABLET | Freq: Two times a day (BID) | ORAL | 0 refills | Status: DC

## 2021-12-11 MED ORDER — NIRMATRELVIR/RITONAVIR (PAXLOVID)TABLET: 3.0000 | ORAL_TABLET | Freq: Two times a day (BID) | ORAL | 0 refills | Status: AC

## 2021-12-11 NOTE — Patient Instructions (Signed)
Stay in your room for 5 days and Then Mask for 10 days Go to ED if Symptoms get worse like SOB or fever Drink Water. Eat whatever possible  Do not go to visit Health care for 10 days

## 2021-12-11 NOTE — Progress Notes (Signed)
Location: [facility] (12)  Provider:   Code Status:  Goals of Care:     09/16/2021   10:19 AM  Advanced Directives  Does Patient Have a Medical Advance Directive? Yes  Type of Paramedic of [place];Living will  Does patient want to make changes to medical advance directive? No - Patient declined  Copy of [place] in Chart? Yes - validated most recent copy scanned in chart (See row information)     Chief Complaint  Patient presents with   Acute Visit    Complains of deep cough, when he coughs something up it's dark, dizzy, loss of appetite.  In Facility Covid test performed and results are POSITIVE.     HPI: Patient is a 88 y.o. male seen today for an acute visit for Above symptoms  Has h/o depression, noninvasive bladder cancer, caregiver stress  Lives in [facility] [place] apartment Came with complaint of cough loss of appetite feeling dizzy tired fatigue since Monday No fever or chills no shortness of breath no chest pain. Tested for COVID and is positive  Past Medical History:  Diagnosis Date   Anxiety    Arthritis    Cancer ([place])    Depression    Pneumonia    Prostate cancer [facility])     Past Surgical History:  Procedure Laterality Date   APPENDECTOMY  1951   CHOLECYSTECTOMY     2006   HERNIA REPAIR  1951   INSERTION PROSTATE RADIATION SEED  2005   MENISCUS REPAIR  1995   TONSILECTOMY, ADENOIDECTOMY, BILATERAL MYRINGOTOMY AND TUBES  1957   trans-urethral resection of prostate  1999, 2003   TRANSURETHRAL RESECTION OF BLADDER TUMOR WITH MITOMYCIN-C N/A 01/08/2021   Procedure: TRANSURETHRAL RESECTION OF BLADDER TUMOR WITH GEMCITABINE;  Surgeon: Irine Seal, MD;  Location: [facility];  Service: Urology;  Laterality: N/A;   WRIST SURGERY  April and July 2021    Allergies  Allergen Reactions   Prednisone Anxiety    Outpatient Encounter Medications as of 12/11/2021  Medication Sig    Cholecalciferol (VITAMIN D) 50 MCG (2000 UT) tablet Take 1 tablet (2,000 Units total) by mouth daily.   escitalopram (LEXAPRO) 10 MG tablet Take 15 mg by mouth daily.   [DISCONTINUED] nirmatrelvir/ritonavir EUA (PAXLOVID) 20 x 150 MG & 10 x '100MG'$  TABS Take 3 tablets by mouth 2 (two) times daily for 5 days. (Take nirmatrelvir 150 mg two tablets twice daily for 5 days and ritonavir 100 mg one tablet twice daily for 5 days) Patient GFR is 85   Facility-Administered Encounter Medications as of 12/11/2021  Medication   gemcitabine (GEMZAR) chemo syringe for bladder instillation 2,000 mg    Review of Systems:  Review of Systems  Constitutional:  Positive for activity change and appetite change. Negative for unexpected weight change.  HENT: Negative.    Respiratory:  Positive for cough. Negative for shortness of breath.   Cardiovascular:  Negative for leg swelling.  Gastrointestinal:  Negative for constipation.  Genitourinary:  Negative for frequency.  Musculoskeletal:  Positive for myalgias. Negative for arthralgias and gait problem.  Skin: Negative.  Negative for rash.  Neurological:  Positive for dizziness and weakness.  Psychiatric/Behavioral:  Negative for confusion and sleep disturbance.   All other systems reviewed and are negative.   Health Maintenance  Topic Date Due   INFLUENZA VACCINE  11/05/2021   COVID-19 Vaccine (4 - Moderna risk series) 02/25/2022 (  Originally 04/02/2021)   TETANUS/TDAP  01/26/2029   Pneumonia Vaccine 80+ Years old  Completed   Zoster Vaccines- Shingrix  Completed   HPV VACCINES  Aged Out    Physical Exam: Vitals:   12/11/21 1415  BP: 136/60  Pulse: 95  Temp: 100.2 F (37.9 C)  TempSrc: Skin  SpO2: 94%  Weight: 180 lb 4.8 oz (81.8 kg)  Height: 6' (1.829 m)   Body mass index is 24.45 kg/m. Physical Exam Vitals reviewed.  Constitutional:      Appearance: Normal appearance.  HENT:     Head: Normocephalic.     Nose: Nose normal.      Mouth/Throat:     Mouth: Mucous membranes are moist.     Pharynx: Oropharynx is clear.  Eyes:     Pupils: Pupils are equal, round, and reactive to light.  Cardiovascular:     Rate and Rhythm: Normal rate and regular rhythm.     Pulses: Normal pulses.     Heart sounds: No murmur heard. Pulmonary:     Effort: Pulmonary effort is normal. No respiratory distress.     Breath sounds: Normal breath sounds. No rales.  Abdominal:     General: Abdomen is flat. Bowel sounds are normal.     Palpations: Abdomen is soft.  Musculoskeletal:        General: No swelling.     Cervical back: Neck supple.  Skin:    General: Skin is warm.  Neurological:     General: No focal deficit present.     Mental Status: He is alert and oriented to person, place, and time.  Psychiatric:        Mood and Affect: Mood normal.        Thought Content: Thought content normal.     Labs reviewed: Basic Metabolic Panel: Recent Labs    05/23/21 0750  NA 137  K 3.9  CL 106  CO2 22  GLUCOSE 124*  BUN 21  CREATININE 0.87  CALCIUM 8.6  MG 2.1  TSH 1.27   Liver Function Tests: Recent Labs    05/23/21 0750  AST 25  ALT 27  BILITOT 0.8  PROT 6.5   No results for input(s): "LIPASE", "AMYLASE" in the last 8760 hours. No results for input(s): "AMMONIA" in the last 8760 hours. CBC: Recent Labs    12/27/20 1134 05/23/21 0750  WBC 6.5 6.2  NEUTROABS  --  4,080  HGB 14.0 13.2  HCT 41.2 39.1  MCV 100.2* 98.2  PLT 212 253   Lipid Panel: Recent Labs    05/23/21 0750  CHOL 175  HDL 53  LDLCALC 105*  TRIG 84  CHOLHDL 3.3   No results found for: "HGBA1C"  Procedures since last visit: No results found.  Assessment/Plan 1. COVID-19 virus infection Started on Paxlovid Go to ED if symptoms get worse including high fever and shortness of breath in his apartment for 5 days and cannot visit his wife in healthcare for 10 days    Labs/tests ordered:  * No order type specified * Next appt:   12/11/2021

## 2021-12-11 NOTE — Telephone Encounter (Signed)
Spoke with patient prescription sent to [facility] on [address].

## 2021-12-11 NOTE — Telephone Encounter (Signed)
Pharmacy called and stated that they do not have paxlovid nor are the able to get any type of antiviral medication. I tried calling patient to see which pharmacy he would like it called into.  Message routed to Dr. Javier Glazier and Anita May, [place]

## 2021-12-18 ENCOUNTER — Telehealth: Payer: Self-pay

## 2021-12-18 NOTE — Telephone Encounter (Signed)
Let him know he can visit his Wife tomorrow but make sure to keep his mask when visiting

## 2021-12-18 NOTE — Telephone Encounter (Signed)
Patient notified and agreed.  

## 2021-12-18 NOTE — Telephone Encounter (Signed)
Patient called wanting clarification about when he can go see his wife. Patient states that he is feeling better after being on medication for COVID for 5 days. He was told that he would be ok to see wife in nursing unit on tomorrow. Your note said for him not to visit health care for 10 days and he just wants to be sure as to when it will be ok for him to visit his wife.  Message routed to Dr. Veleta Miners

## 2021-12-23 ENCOUNTER — Other Ambulatory Visit: Payer: Self-pay | Admitting: Internal Medicine

## 2021-12-23 LAB — COMPLETE METABOLIC PANEL WITH GFR
AG Ratio: 1.3 (calc) (ref 1.0–2.5)
ALT: 19 U/L (ref 9–46)
AST: 22 U/L (ref 10–35)
Albumin: 3.8 g/dL (ref 3.6–5.1)
Alkaline phosphatase (APISO): 60 U/L (ref 35–144)
BUN: 19 mg/dL (ref 7–25)
CO2: 23 mmol/L (ref 20–32)
Calcium: 9 mg/dL (ref 8.6–10.3)
Chloride: 106 mmol/L (ref 98–110)
Creat: 0.83 mg/dL (ref 0.70–1.22)
Globulin: 2.9 g/dL (calc) (ref 1.9–3.7)
Glucose, Bld: 81 mg/dL (ref 65–99)
Potassium: 4.2 mmol/L (ref 3.5–5.3)
Sodium: 139 mmol/L (ref 135–146)
Total Bilirubin: 0.6 mg/dL (ref 0.2–1.2)
Total Protein: 6.7 g/dL (ref 6.1–8.1)
eGFR: 86 mL/min/{1.73_m2} (ref >=60)

## 2021-12-23 LAB — CBC WITH DIFFERENTIAL/PLATELET
Absolute Monocytes: 824 cells/uL (ref 200–950)
Basophils Absolute: 18 cells/uL (ref 0–200)
Basophils Relative: 0.3 %
Eosinophils Absolute: 128 cells/uL (ref 15–500)
Eosinophils Relative: 2.1 %
HCT: 40 % (ref 38.5–50.0)
Hemoglobin: 13.8 g/dL (ref 13.2–17.1)
Lymphs Abs: 1641 cells/uL (ref 850–3900)
MCH: 33.8 pg — ABNORMAL HIGH (ref 27.0–33.0)
MCHC: 34.5 g/dL (ref 32.0–36.0)
MCV: 98 fL (ref 80.0–100.0)
MPV: 9.6 fL (ref 7.5–12.5)
Monocytes Relative: 13.5 %
Neutro Abs: 3489 cells/uL (ref 1500–7800)
Neutrophils Relative %: 57.2 %
Platelets: 281 10*3/uL (ref 140–400)
RBC: 4.08 10*6/uL — ABNORMAL LOW (ref 4.20–5.80)
RDW: 12.4 % (ref 11.0–15.0)
Total Lymphocyte: 26.9 %
WBC: 6.1 10*3/uL (ref 3.8–10.8)

## 2021-12-23 LAB — LIPID PANEL
Cholesterol: 186 mg/dL (ref <200)
HDL: 49 mg/dL (ref >=40)
LDL Cholesterol (Calc): 116 mg/dL (calc) — ABNORMAL HIGH
Non-HDL Cholesterol (Calc): 137 mg/dL (calc) — ABNORMAL HIGH (ref <130)
Total CHOL/HDL Ratio: 3.8 (calc) (ref <5.0)
Triglycerides: 105 mg/dL (ref <150)

## 2021-12-23 LAB — TSH: TSH: 1.56 mIU/L (ref 0.40–4.50)

## 2021-12-25 ENCOUNTER — Non-Acute Institutional Stay: Payer: Medicare Other | Admitting: Internal Medicine

## 2021-12-25 ENCOUNTER — Encounter: Payer: Self-pay | Admitting: Internal Medicine

## 2021-12-25 VITALS — BP 122/70 | HR 80 | Temp 97.6°F | Ht 72.0 in | Wt 177.8 lb

## 2021-12-25 DIAGNOSIS — N3945 Continuous leakage: Secondary | ICD-10-CM

## 2021-12-25 DIAGNOSIS — U071 COVID-19: Secondary | ICD-10-CM

## 2021-12-25 DIAGNOSIS — H905 Unspecified sensorineural hearing loss: Secondary | ICD-10-CM

## 2021-12-25 DIAGNOSIS — Z636 Dependent relative needing care at home: Secondary | ICD-10-CM | POA: Diagnosis not present

## 2021-12-25 DIAGNOSIS — C679 Malignant neoplasm of bladder, unspecified: Secondary | ICD-10-CM

## 2021-12-25 DIAGNOSIS — F339 Major depressive disorder, recurrent, unspecified: Secondary | ICD-10-CM

## 2021-12-25 DIAGNOSIS — K580 Irritable bowel syndrome with diarrhea: Secondary | ICD-10-CM | POA: Diagnosis not present

## 2021-12-25 DIAGNOSIS — E782 Mixed hyperlipidemia: Secondary | ICD-10-CM

## 2021-12-28 NOTE — Progress Notes (Signed)
Location:  [facility] (12)  Provider:   Code Status:  Goals of Care:     12/25/2021    2:05 PM  Advanced Directives  Does Patient Have a Medical Advance Directive? Yes  Type of Paramedic of [place];Living will  Does patient want to make changes to medical advance directive? No - Patient declined     Chief Complaint  Patient presents with   Medical Management of Chronic Issues    6 Month Follow up. To discuss flu vaccine or postpone if patient refuses. [facility] Verified.     HPI: Patient is a 88 y.o. male seen today for medical management of chronic diseases.    No Acute complains  Patient has a history of Anxiety disease and psychologist and therapist Recent worsening due to his wife who is now under hospice care Recent COVID-19 infection.  Treated with Paxlovid Hyperlipidemia History of bladder cancer and now having urinary incontinence Insomnia IBS And chronic back pain  Very independent otherwise still drives.  Has daughter who is PA in [place].  Past Medical History:  Diagnosis Date   Anxiety    Arthritis    Cancer ([place])    Depression    Pneumonia    Prostate cancer [facility])     Past Surgical History:  Procedure Laterality Date   APPENDECTOMY  1951   CHOLECYSTECTOMY     2006   HERNIA REPAIR  1951   INSERTION PROSTATE RADIATION SEED  2005   MENISCUS REPAIR  1995   TONSILECTOMY, ADENOIDECTOMY, BILATERAL MYRINGOTOMY AND TUBES  1957   trans-urethral resection of prostate  1999, 2003   TRANSURETHRAL RESECTION OF BLADDER TUMOR WITH MITOMYCIN-C N/A 01/08/2021   Procedure: TRANSURETHRAL RESECTION OF BLADDER TUMOR WITH GEMCITABINE;  Surgeon: Irine Seal, MD;  [facility];  Service: Urology;  Laterality: N/A;   WRIST SURGERY  April and July 2021    Allergies  Allergen Reactions   Prednisone Anxiety    Outpatient Encounter Medications as of 12/25/2021  Medication Sig   ALPRAZolam (XANAX)  0.25 MG tablet Take 0.25 mg by mouth daily as needed for anxiety.   Cholecalciferol (VITAMIN D) 50 MCG (2000 UT) tablet Take 1 tablet (2,000 Units total) by mouth daily.   escitalopram (LEXAPRO) 10 MG tablet Take 15 mg by mouth daily.   Facility-Administered Encounter Medications as of 12/25/2021  Medication   gemcitabine (GEMZAR) chemo syringe for bladder instillation 2,000 mg    Review of Systems:  Review of Systems  Constitutional:  Negative for activity change, appetite change and unexpected weight change.  HENT: Negative.    Respiratory:  Negative for cough and shortness of breath.   Cardiovascular:  Negative for leg swelling.  Gastrointestinal:  Negative for constipation.  Genitourinary:  Negative for frequency.  Musculoskeletal:  Negative for arthralgias, gait problem and myalgias.  Skin: Negative.  Negative for rash.  Neurological:  Negative for dizziness and weakness.  Psychiatric/Behavioral:  Negative for confusion and sleep disturbance.   All other systems reviewed and are negative.   Health Maintenance  Topic Date Due   INFLUENZA VACCINE  11/05/2021   COVID-19 Vaccine (5 - Moderna risk series) 11/13/2021   TETANUS/TDAP  08/08/2029   Pneumonia Vaccine 33+ Years old  Completed   Zoster Vaccines- Shingrix  Completed   HPV VACCINES  Aged Out    Physical Exam: Vitals:   12/25/21 1405  BP: 122/70  Pulse: 80  Temp: 97.6 F (  36.4 C)  SpO2: 96%  Weight: 177 lb 12.8 oz (80.6 kg)  Height: 6' (1.829 m)   Body mass index is 24.11 kg/m. Physical Exam Vitals reviewed.  Constitutional:      Appearance: Normal appearance.  HENT:     Head: Normocephalic.     Nose: Nose normal.     Mouth/Throat:     Mouth: Mucous membranes are moist.     Pharynx: Oropharynx is clear.  Eyes:     Pupils: Pupils are equal, round, and reactive to light.  Cardiovascular:     Rate and Rhythm: Normal rate and regular rhythm.     Pulses: Normal pulses.     Heart sounds: No murmur  heard. Pulmonary:     Effort: Pulmonary effort is normal. No respiratory distress.     Breath sounds: Normal breath sounds. No rales.  Abdominal:     General: Abdomen is flat. Bowel sounds are normal.     Palpations: Abdomen is soft.  Musculoskeletal:        General: No swelling.     Cervical back: Neck supple.  Skin:    General: Skin is warm.  Neurological:     General: No focal deficit present.     Mental Status: He is alert and oriented to person, place, and time.  Psychiatric:        Mood and Affect: Mood normal.        Thought Content: Thought content normal.     Labs reviewed: Basic Metabolic Panel: Recent Labs    05/23/21 0750 12/23/21 0815  NA 137 139  K 3.9 4.2  CL 106 106  CO2 22 23  GLUCOSE 124* 81  BUN 21 19  CREATININE 0.87 0.83  CALCIUM 8.6 9.0  MG 2.1  --   TSH 1.27 1.56   Liver Function Tests: Recent Labs    05/23/21 0750 12/23/21 0815  AST 25 22  ALT 27 19  BILITOT 0.8 0.6  PROT 6.5 6.7   No results for input(s): "LIPASE", "AMYLASE" in the last 8760 hours. No results for input(s): "AMMONIA" in the last 8760 hours. CBC: Recent Labs    05/23/21 0750 12/23/21 0815  WBC 6.2 6.1  NEUTROABS 4,080 3,489  HGB 13.2 13.8  HCT 39.1 40.0  MCV 98.2 98.0  PLT 253 281   Lipid Panel: Recent Labs    05/23/21 0750 12/23/21 0815  CHOL 175 186  HDL 53 49  LDLCALC 105* 116*  TRIG 84 105  CHOLHDL 3.3 3.8   No results found for: "HGBA1C"  Procedures since last visit: No results found.  Assessment/Plan 1. Mixed hyperlipidemia No Risk factors Discussed statins To consider low dose next visit  2. Depression, recurrent ([place]) On Lexapro and Xanax  3. Irritable bowel syndrome with diarrhea   4. Caregiver stress Wife in hospice care now  5. COVID-19 virus infection Resolved  6. Malignant neoplasm of urinary bladder, unspecified site [facility]) Follows with Urology  7. Sensorineural hearing loss (SNHL) of left ear, unspecified hearing  status on contralateral side Follow with [facility] Urinary leakage Due to bladder cancer treatment    Labs/tests ordered:  * No order type specified * Next appt:  Visit date not found

## 2022-03-05 ENCOUNTER — Non-Acute Institutional Stay: Payer: Medicare Other | Admitting: Internal Medicine

## 2022-03-05 ENCOUNTER — Encounter: Payer: Self-pay | Admitting: Internal Medicine

## 2022-03-05 VITALS — BP 132/66 | HR 84 | Temp 97.6°F | Resp 17 | Ht 72.0 in | Wt 184.4 lb

## 2022-03-05 DIAGNOSIS — J00 Acute nasopharyngitis [common cold]: Secondary | ICD-10-CM

## 2022-03-05 DIAGNOSIS — M25571 Pain in right ankle and joints of right foot: Secondary | ICD-10-CM

## 2022-03-05 DIAGNOSIS — F339 Major depressive disorder, recurrent, unspecified: Secondary | ICD-10-CM | POA: Diagnosis not present

## 2022-03-05 NOTE — Patient Instructions (Signed)
Flonase 2 spray in Both Nasal in the evening for 3 weeks Let me know if it does not get better

## 2022-03-06 NOTE — Progress Notes (Signed)
Location: [place] [facility]inic (12)  Provider:   Code Status:  Goals of Care:     03/05/2022    8:55 AM  Advanced Directives  Does Patient Have a Medical Advance Directive? Yes  Type of Paramedic of [place];Living will;Out of facility DNR (pink MOST or yellow form)  Copy of [place] in Chart? Yes - validated most recent copy scanned in chart (See row information)     Chief Complaint  Patient presents with   Acute Visit    Patient states he has been having a nasal problem at night with some difficult to breath and some bloody discharge that has started months ago     HPI: Patient is a 88 y.o. male seen today for an acute visit for Nasal Discharge in the morning Which is blood Tinged No issues during day time No Sinus pain  Grieving due to loss of his Wife Managing with Lexapro  C/O Right Ankle pain /swelling sometimes He walks to go to [place] and the road is uneven No Injury or fall  Other history Patient has a history of Anxiety disease and psychologist and therapist Recent COVID-19 infection.  Treated with Paxlovid Hyperlipidemia History of bladder cancer and now having urinary incontinence Insomnia IBS And chronic back pain Very independent otherwise still drives. Has daughter who is PA in [place].  Past Medical History:  Diagnosis Date   Anxiety    Arthritis    Cancer ([place])    Depression    Pneumonia    Prostate cancer [facility])     Past Surgical History:  Procedure Laterality Date   APPENDECTOMY  1951   CHOLECYSTECTOMY     2006   HERNIA REPAIR  1951   INSERTION PROSTATE RADIATION SEED  2005   MENISCUS REPAIR  1995   TONSILECTOMY, ADENOIDECTOMY, BILATERAL MYRINGOTOMY AND TUBES  1957   trans-urethral resection of prostate  1999, 2003   TRANSURETHRAL RESECTION OF BLADDER TUMOR WITH MITOMYCIN-C N/A 01/08/2021   Procedure: TRANSURETHRAL RESECTION OF BLADDER TUMOR WITH GEMCITABINE;   Surgeon: Irine Seal, MD;  [facility];  Service: Urology;  Laterality: N/A;   WRIST SURGERY  April and July 2021    Allergies  Allergen Reactions   Prednisone Anxiety    Outpatient Encounter Medications as of 03/05/2022  Medication Sig   Cholecalciferol (VITAMIN D) 50 MCG (2000 UT) tablet Take 1 tablet (2,000 Units total) by mouth daily.   escitalopram (LEXAPRO) 10 MG tablet Take 15 mg by mouth daily.   [DISCONTINUED] escitalopram (LEXAPRO) 5 MG tablet Take 5 mg by mouth daily.   ALPRAZolam (XANAX) 0.25 MG tablet Take 0.25 mg by mouth daily as needed for anxiety. (Patient not taking: Reported on 03/05/2022)   Facility-Administered Encounter Medications as of 03/05/2022  Medication   gemcitabine (GEMZAR) chemo syringe for bladder instillation 2,000 mg    Review of Systems:  Review of Systems  Constitutional:  Negative for activity change, appetite change and unexpected weight change.  HENT:  Positive for rhinorrhea.   Respiratory:  Negative for cough and shortness of breath.   Cardiovascular:  Negative for leg swelling.  Gastrointestinal:  Negative for constipation.  Genitourinary:  Negative for frequency.  Musculoskeletal:  Positive for joint swelling. Negative for arthralgias, gait problem and myalgias.  Skin: Negative.  Negative for rash.  Neurological:  Negative for dizziness and weakness.  Psychiatric/Behavioral:  Positive for dysphoric mood. Negative for confusion and sleep disturbance.  All other systems reviewed and are negative.   Health Maintenance  Topic Date Due   INFLUENZA VACCINE  11/05/2021   COVID-19 Vaccine (5 - 2023-24 season) 12/06/2021   Medicare Annual Wellness (AWV)  09/17/2022   DTaP/Tdap/Td (4 - Td or Tdap) 08/08/2029   Pneumonia Vaccine 33+ Years old  Completed   Zoster Vaccines- Shingrix  Completed   HPV VACCINES  Aged Out    Physical Exam: Vitals:   03/05/22 0852  BP: 132/66  Pulse: 84  Resp: 17  Temp: 97.6 F (36.4 C)  TempSrc:  Temporal  SpO2: 95%  Weight: 184 lb 6.4 oz (83.6 kg)  Height: 6' (1.829 m)   Body mass index is 25.01 kg/m. Physical Exam Vitals reviewed.  Constitutional:      Appearance: Normal appearance.  HENT:     Head: Normocephalic.     Nose: Congestion present.     Mouth/Throat:     Mouth: Mucous membranes are moist.     Pharynx: Oropharynx is clear.  Eyes:     Pupils: Pupils are equal, round, and reactive to light.  Cardiovascular:     Rate and Rhythm: Normal rate and regular rhythm.     Pulses: Normal pulses.     Heart sounds: No murmur heard. Pulmonary:     Effort: Pulmonary effort is normal. No respiratory distress.     Breath sounds: Normal breath sounds. No rales.  Abdominal:     General: Abdomen is flat. Bowel sounds are normal.     Palpations: Abdomen is soft.  Musculoskeletal:        General: No swelling.     Cervical back: Neck supple.     Comments: Did not see any Swelling or Inflammation in his right Ankle No pain   Skin:    General: Skin is warm.  Neurological:     General: No focal deficit present.     Mental Status: He is alert and oriented to person, place, and time.  Psychiatric:        Mood and Affect: Mood normal.        Thought Content: Thought content normal.     Labs reviewed: Basic Metabolic Panel: Recent Labs    05/23/21 0750 12/23/21 0815  NA 137 139  K 3.9 4.2  CL 106 106  CO2 22 23  GLUCOSE 124* 81  BUN 21 19  CREATININE 0.87 0.83  CALCIUM 8.6 9.0  MG 2.1  --   TSH 1.27 1.56   Liver Function Tests: Recent Labs    05/23/21 0750 12/23/21 0815  AST 25 22  ALT 27 19  BILITOT 0.8 0.6  PROT 6.5 6.7   No results for input(s): "LIPASE", "AMYLASE" in the last 8760 hours. No results for input(s): "AMMONIA" in the last 8760 hours. CBC: Recent Labs    05/23/21 0750 12/23/21 0815  WBC 6.2 6.1  NEUTROABS 4,080 3,489  HGB 13.2 13.8  HCT 39.1 40.0  MCV 98.2 98.0  PLT 253 281   Lipid Panel: Recent Labs    05/23/21 0750  12/23/21 0815  CHOL 175 186  HDL 53 49  LDLCALC 105* 116*  TRIG 84 105  CHOLHDL 3.3 3.8   No results found for: "HGBA1C"  Procedures since last visit: No results found.  Assessment/Plan 1. Acute rhinitis Try Flonase OTC 2 spray in the evening for 3 weeks   2. Acute right ankle pain EXAM normal Use better shoe support If not better Ortho referal  3. Depression, recurrent (  [place]) Lexapro    Labs/tests ordered:  * No order type specified * Next appt:  06/25/2022

## 2022-06-25 ENCOUNTER — Non-Acute Institutional Stay: Payer: Medicare Other | Admitting: Internal Medicine

## 2022-06-25 ENCOUNTER — Encounter: Payer: Self-pay | Admitting: Internal Medicine

## 2022-06-25 VITALS — BP 136/88 | HR 75 | Temp 97.8°F | Resp 17 | Ht 72.0 in | Wt 191.3 lb

## 2022-06-25 DIAGNOSIS — M7989 Other specified soft tissue disorders: Secondary | ICD-10-CM

## 2022-06-25 DIAGNOSIS — F339 Major depressive disorder, recurrent, unspecified: Secondary | ICD-10-CM

## 2022-06-25 DIAGNOSIS — J00 Acute nasopharyngitis [common cold]: Secondary | ICD-10-CM

## 2022-06-25 DIAGNOSIS — E782 Mixed hyperlipidemia: Secondary | ICD-10-CM

## 2022-06-25 DIAGNOSIS — C679 Malignant neoplasm of bladder, unspecified: Secondary | ICD-10-CM

## 2022-06-25 NOTE — Progress Notes (Signed)
Location:  [facility] (12)  Provider:   Code Status:  Goals of Care:     06/25/2022   10:51 AM  Advanced Directives  Does Patient Have a Medical Advance Directive? Yes  Type of Paramedic of [place];Living will;Out of facility DNR (pink MOST or yellow form)  Does patient want to make changes to medical advance directive? No - Patient declined  Copy of [place] in Chart? Yes - validated most recent copy scanned in chart (See row information)     Chief Complaint  Patient presents with   Medical Management of Chronic Issues    6 month follow up to discuss labs. Patient would like to discuss his weight gain     HPI: Patient is a 89 y.o. male seen today for medical management of chronic diseases.    Lives in [place] in [place] to follow up with many issues  Lost wife few months ago Still feeling depressed Follows with psychiatry and is on Lexapro.  Is not taking Xanax anymore Right leg swelling Has mild swelling in both legs but right more than left Has noticed it for the last few weeks.  The legs been swollen and gets worse as the day goes by Right shoulder pain S/p steroid injection by [facility] Pain is back and wanted to know if he can go for repeat injections Nasal congestion Flonase helped but then he stopped it and is having worsening congestion  Continues to have issues with IBS with diarrhea Has gained weight. Also has a history of noninvasive bladder cancer treated with BCG now on surveillance every 6 months with cystoscopy  Past Medical History:  Diagnosis Date   Anxiety    Arthritis    Cancer ([place])    Depression    Pneumonia    Prostate cancer [facility])     Past Surgical History:  Procedure Laterality Date   [place]     2006   [place]  2005   [place],  ADENOIDECTOMY, BILATERAL MYRINGOTOMY AND TUBES  1957   trans-urethral resection of prostate  1999, 2003   TRANSURETHRAL RESECTION OF BLADDER TUMOR WITH MITOMYCIN-C N/A 01/08/2021   Procedure: TRANSURETHRAL RESECTION OF BLADDER TUMOR WITH GEMCITABINE;  Surgeon: Irine Seal, MD;  Location: [facility];  Service: Urology;  Laterality: N/A;   WRIST SURGERY  April and July 2021    Allergies  Allergen Reactions   Prednisone Anxiety    Outpatient Encounter Medications as of 06/25/2022  Medication Sig   Cholecalciferol (VITAMIN D) 50 MCG (2000 UT) tablet Take 1 tablet (2,000 Units total) by mouth daily.   escitalopram (LEXAPRO) 20 MG tablet Take 20 mg by mouth daily.   ALPRAZolam (XANAX) 0.25 MG tablet Take 0.25 mg by mouth daily as needed for anxiety. (Patient not taking: Reported on 03/05/2022)   Facility-Administered Encounter Medications as of 06/25/2022  Medication   gemcitabine (GEMZAR) chemo syringe for bladder instillation 2,000 mg    Review of Systems:  Review of Systems  Constitutional:  Negative for activity change, appetite change and unexpected weight change.  HENT: Negative.    Respiratory:  Negative for cough and shortness of breath.   Cardiovascular:  Positive for leg swelling.  Gastrointestinal:  Negative for constipation.  Genitourinary:  Negative for frequency.  Musculoskeletal:  Positive for arthralgias, back  pain and myalgias. Negative for gait problem.  Skin: Negative.  Negative for rash.  Neurological:  Negative for dizziness and weakness.  Psychiatric/Behavioral:  Negative for confusion and sleep disturbance.   All other systems reviewed and are negative.   Health Maintenance  Topic Date Due   COVID-19 Vaccine (6 - 2023-24 season) 09/10/2022 (Originally 04/08/2022)   Medicare Annual Wellness (AWV)  09/17/2022   DTaP/Tdap/Td (4 - Td or Tdap) 08/08/2029   Pneumonia Vaccine 37+ Years old  Completed   INFLUENZA VACCINE  Completed   Zoster Vaccines- Shingrix  Completed    HPV VACCINES  Aged Out    Physical Exam: Vitals:   06/25/22 1048  BP: 136/88  Pulse: 75  Resp: 17  Temp: 97.8 F (36.6 C)  TempSrc: Temporal  SpO2: 99%  Weight: 191 lb 4.8 oz (86.8 kg)  Height: 6' (1.829 m)   Body mass index is 25.94 kg/m. Physical Exam Vitals reviewed.  Constitutional:      Appearance: Normal appearance.  HENT:     Head: Normocephalic.     Nose: Nose normal.     Mouth/Throat:     Mouth: Mucous membranes are moist.     Pharynx: Oropharynx is clear.  Eyes:     Pupils: Pupils are equal, round, and reactive to light.  Cardiovascular:     Rate and Rhythm: Normal rate and regular rhythm.     Pulses: Normal pulses.     Heart sounds: No murmur heard. Pulmonary:     Effort: Pulmonary effort is normal. No respiratory distress.     Breath sounds: Normal breath sounds. No rales.  Abdominal:     General: Abdomen is flat. Bowel sounds are normal.     Palpations: Abdomen is soft.  Musculoskeletal:        General: No swelling.     Cervical back: Neck supple.     Comments: Right leg Moderate swelling with no pain or redness Left Leg trace edema  Skin:    General: Skin is warm.  Neurological:     General: No focal deficit present.     Mental Status: He is alert and oriented to person, place, and time.  Psychiatric:        Mood and Affect: Mood normal.        Thought Content: Thought content normal.     Labs reviewed: Basic Metabolic Panel: Recent Labs    12/23/21 0815  NA 139  K 4.2  CL 106  CO2 23  GLUCOSE 81  BUN 19  CREATININE 0.83  CALCIUM 9.0  TSH 1.56   Liver Function Tests: Recent Labs    12/23/21 0815  AST 22  ALT 19  BILITOT 0.6  PROT 6.7   No results for input(s): "LIPASE", "AMYLASE" in the last 8760 hours. No results for input(s): "AMMONIA" in the last 8760 hours. CBC: Recent Labs    12/23/21 0815  WBC 6.1  NEUTROABS 3,489  HGB 13.8  HCT 40.0  MCV 98.0  PLT 281   Lipid Panel: Recent Labs    12/23/21 0815   CHOL 186  HDL 49  LDLCALC 116*  TRIG 105  CHOLHDL 3.8   No results found for: "HGBA1C"  Procedures since last visit: No results found.  Assessment/Plan 1. Right leg swelling  - VAS [place] LOWER EXTREMITY VENOUS (DVT); Future  2. Acute rhinitis Flonase PRN  3. Depression, recurrent ([place]) Lexapro  4. Mixed hyperlipidemia  - Lipid panel - COMPLETE METABOLIC PANEL WITH GFR -  TSH  5. Malignant neoplasm of urinary bladder, unspecified site (HCC) On Surveillance - CBC with Differential/Platelet 6 IBS Will let me know if symptoms worse for GI   7 Right shoulder pain Will follow with Ortho   Labs/tests ordered:   Next appt:  Visit date not found  Total time spent in this patient care encounter was  45_  minutes; greater than 50% of the visit spent counseling patient and staff, reviewing records , Labs and coordinating care for problems addressed at this encounter.

## 2022-06-26 ENCOUNTER — Telehealth (HOSPITAL_BASED_OUTPATIENT_CLINIC_OR_DEPARTMENT_OTHER): Payer: Self-pay | Admitting: *Deleted

## 2022-06-26 NOTE — Telephone Encounter (Signed)
Left message for patient to call and discuss scheduling the lower venous doppler ordered by Dr. Gupta 

## 2022-06-27 ENCOUNTER — Ambulatory Visit (HOSPITAL_COMMUNITY)
Admission: RE | Admit: 2022-06-27 | Discharge: 2022-06-27 | Disposition: A | Discharge status: Home / Self Care | Payer: Medicare Other | Source: Ambulatory Visit | Attending: Internal Medicine | Admitting: Internal Medicine

## 2022-06-27 DIAGNOSIS — M7989 Other specified soft tissue disorders: Secondary | ICD-10-CM | POA: Insufficient documentation

## 2022-07-03 ENCOUNTER — Other Ambulatory Visit: Payer: Medicare Other

## 2022-07-03 LAB — CBC WITH DIFFERENTIAL/PLATELET
Absolute Monocytes: 704 cells/uL (ref 200–950)
Basophils Absolute: 70 cells/uL (ref 0–200)
Basophils Relative: 1.1 %
Eosinophils Absolute: 320 cells/uL (ref 15–500)
Eosinophils Relative: 5 %
HCT: 42.1 % (ref 38.5–50.0)
Hemoglobin: 14.1 g/dL (ref 13.2–17.1)
Lymphs Abs: 1741 cells/uL (ref 850–3900)
MCH: 33.6 pg — ABNORMAL HIGH (ref 27.0–33.0)
MCHC: 33.5 g/dL (ref 32.0–36.0)
MCV: 100.2 fL — ABNORMAL HIGH (ref 80.0–100.0)
MPV: 10.2 fL (ref 7.5–12.5)
Monocytes Relative: 11 %
Neutro Abs: 3565 cells/uL (ref 1500–7800)
Neutrophils Relative %: 55.7 %
Platelets: 247 10*3/uL (ref 140–400)
RBC: 4.2 10*6/uL (ref 4.20–5.80)
RDW: 12.5 % (ref 11.0–15.0)
Total Lymphocyte: 27.2 %
WBC: 6.4 10*3/uL (ref 3.8–10.8)

## 2022-07-03 LAB — COMPLETE METABOLIC PANEL WITH GFR
AG Ratio: 1.4 (calc) (ref 1.0–2.5)
ALT: 10 U/L (ref 9–46)
AST: 17 U/L (ref 10–35)
Albumin: 3.9 g/dL (ref 3.6–5.1)
Alkaline phosphatase (APISO): 59 U/L (ref 35–144)
BUN: 21 mg/dL (ref 7–25)
CO2: 22 mmol/L (ref 20–32)
Calcium: 8.9 mg/dL (ref 8.6–10.3)
Chloride: 108 mmol/L (ref 98–110)
Creat: 0.86 mg/dL (ref 0.70–1.22)
Globulin: 2.8 g/dL (calc) (ref 1.9–3.7)
Glucose, Bld: 83 mg/dL (ref 65–99)
Potassium: 4.3 mmol/L (ref 3.5–5.3)
Sodium: 139 mmol/L (ref 135–146)
Total Bilirubin: 0.7 mg/dL (ref 0.2–1.2)
Total Protein: 6.7 g/dL (ref 6.1–8.1)
eGFR: 84 mL/min/{1.73_m2} (ref >=60)

## 2022-07-03 LAB — LIPID PANEL
Cholesterol: 181 mg/dL (ref <200)
HDL: 52 mg/dL (ref >=40)
LDL Cholesterol (Calc): 110 mg/dL (calc) — ABNORMAL HIGH
Non-HDL Cholesterol (Calc): 129 mg/dL (calc) (ref <130)
Total CHOL/HDL Ratio: 3.5 (calc) (ref <5.0)
Triglycerides: 94 mg/dL (ref <150)

## 2022-07-04 LAB — TSH: TSH: 1.46 mIU/L (ref 0.40–4.50)

## 2022-07-07 ENCOUNTER — Telehealth: Payer: Self-pay

## 2022-07-07 DIAGNOSIS — H908 Mixed conductive and sensorineural hearing loss, unspecified: Secondary | ICD-10-CM

## 2022-07-07 NOTE — Telephone Encounter (Signed)
Patient states he called last week to request a referral to [facility] and had not heard anything back from our office. Patient states its time for him to be evaluated again by the audiologist and we need to place a new referral order.  I reviewed chart and was unable to find documentation of patient calling last week. I informed patient that we have placed a referral in the past and he should not need a new referral. Patient verbalized understanding.  I called [facility], as we referred patient to them last year and left a message requesting a return call to confirm whether or not patients require a new referral every year. Awaiting return call.

## 2022-07-08 ENCOUNTER — Telehealth: Payer: Self-pay

## 2022-07-08 NOTE — Telephone Encounter (Signed)
This encounter was created in error - please disregard.

## 2022-07-08 NOTE — Telephone Encounter (Signed)
Patient left message on clinical intake voicemail inquiring about Vitamin D. Patient stated he forgot how much he is supposed to be taking because he bought 1000 units and couldn't remember if he is to take 1000 units or 2000 units. I returned call to patient, but no answer. I left message for patient with the dosage and instructions. Patient is to take 2000 units daily.

## 2022-07-08 NOTE — Telephone Encounter (Signed)
Chloe with [facility] called back and patient does need new referral. He needs a new referral because he has medicare and he is having his hearing tested. Referral is needed yearly. Please place referral.  Message routed to Dr. Veleta Miners

## 2022-12-31 ENCOUNTER — Non-Acute Institutional Stay: Payer: Medicare Other | Admitting: Internal Medicine

## 2022-12-31 ENCOUNTER — Encounter: Payer: Self-pay | Admitting: Internal Medicine

## 2022-12-31 VITALS — BP 112/68 | HR 74 | Temp 97.8°F | Resp 16 | Ht 72.0 in | Wt 180.0 lb

## 2022-12-31 DIAGNOSIS — F339 Major depressive disorder, recurrent, unspecified: Secondary | ICD-10-CM

## 2022-12-31 DIAGNOSIS — K580 Irritable bowel syndrome with diarrhea: Secondary | ICD-10-CM | POA: Diagnosis not present

## 2022-12-31 DIAGNOSIS — E782 Mixed hyperlipidemia: Secondary | ICD-10-CM | POA: Diagnosis not present

## 2022-12-31 DIAGNOSIS — G8929 Other chronic pain: Secondary | ICD-10-CM

## 2022-12-31 DIAGNOSIS — R600 Localized edema: Secondary | ICD-10-CM

## 2022-12-31 DIAGNOSIS — M545 Low back pain, unspecified: Secondary | ICD-10-CM

## 2022-12-31 DIAGNOSIS — M25511 Pain in right shoulder: Secondary | ICD-10-CM

## 2022-12-31 DIAGNOSIS — C679 Malignant neoplasm of bladder, unspecified: Secondary | ICD-10-CM

## 2022-12-31 DIAGNOSIS — H905 Unspecified sensorineural hearing loss: Secondary | ICD-10-CM

## 2022-12-31 DIAGNOSIS — R058 Other specified cough: Secondary | ICD-10-CM

## 2022-12-31 NOTE — Progress Notes (Signed)
[facility]  [facility] (12)  Provider:   Code Status:  Goals of Care:     12/31/2022   10:52 AM  Advanced Directives  Does Patient Have a Medical Advance Directive? Yes  Type of Estate agent of [place];Living will;Out of facility DNR (pink MOST or yellow form)  Does patient want to make changes to medical advance directive? No - Patient declined  Copy of Healthcare Power of Attorney in Chart? Yes - validated most recent copy scanned in chart (See row information)     Chief Complaint  Patient presents with   Medical Management of Chronic Issues    Patient is being seen for a 6 month follow up with labs. Patient has some concerns   Immunizations    Discuss the need for flu and shingles vaccine   Quality Metric Gaps    Patient is due for AWV    HPI: Patient is a 89 y.o. male seen today for medical management of chronic diseases.   [place] in [facility] Lost Wife almost year ago  Acute issues  Anxiety with depression Sees Therapist Stable on Lexapro  H/o Urinary incontinence after undergoing Bladder cancer Treatment  Right Shoulder pain Has seen Ortho Does his exercises  IBS with Loose stools  Bilateral Leg swelling Mild Dopplers negative Cough Recently ? Post nasal drip  Has lost weight intentional with Diet and exercise  Very independent otherwise still drives. Has daughter who is PA in [place].  Past Medical History:  Diagnosis Date   Anxiety    Arthritis    Cancer (HCC)    Depression    Pneumonia    Prostate cancer [facility])     Past Surgical History:  Procedure Laterality Date   APPENDECTOMY  1951   CHOLECYSTECTOMY     2006   HERNIA REPAIR  1951   INSERTION PROSTATE RADIATION SEED  2005   MENISCUS REPAIR  1995   TONSILECTOMY, ADENOIDECTOMY, BILATERAL MYRINGOTOMY AND TUBES  1957   trans-urethral resection of prostate  1999, 2003   TRANSURETHRAL RESECTION OF BLADDER TUMOR WITH MITOMYCIN-C N/A  01/08/2021   Procedure: TRANSURETHRAL RESECTION OF BLADDER TUMOR WITH GEMCITABINE;  Surgeon: Bjorn Pippin, MD;  [facility];  Service: Urology;  Laterality: N/A;   WRIST SURGERY  April and July 2021    Allergies  Allergen Reactions   Prednisone Anxiety    Outpatient Encounter Medications as of 12/31/2022  Medication Sig   Cholecalciferol (VITAMIN D) 50 MCG (2000 UT) tablet Take 1 tablet (2,000 Units total) by mouth daily.   escitalopram (LEXAPRO) 20 MG tablet Take 20 mg by mouth daily.   Facility-Administered Encounter Medications as of 12/31/2022  Medication   gemcitabine (GEMZAR) chemo syringe for bladder instillation 2,000 mg    Review of Systems:  Review of Systems  Constitutional:  Negative for activity change, appetite change and unexpected weight change.  HENT: Negative.    Respiratory:  Positive for cough. Negative for shortness of breath.   Cardiovascular:  Positive for leg swelling.  Gastrointestinal:  Positive for diarrhea. Negative for constipation.  Genitourinary:  Positive for frequency and urgency.  Musculoskeletal:  Negative for arthralgias, gait problem and myalgias.  Skin: Negative.  Negative for rash.  Neurological:  Negative for dizziness and weakness.  Psychiatric/Behavioral:  Positive for dysphoric mood. Negative for confusion and sleep disturbance. The patient is nervous/anxious.   All other systems reviewed and are negative.   Health Maintenance  Topic Date Due   Medicare Annual Wellness (AWV)  09/17/2022   INFLUENZA VACCINE  11/06/2022   COVID-19 Vaccine (6 - 2023-24 season) 12/07/2022   DTaP/Tdap/Td (4 - Td or Tdap) 08/08/2029   Pneumonia Vaccine 74+ Years old  Completed   Zoster Vaccines- Shingrix  Completed   HPV VACCINES  Aged Out    Physical Exam: Vitals:   12/31/22 1050  BP: 112/68  Pulse: 74  Resp: 16  Temp: 97.8 F (36.6 C)  TempSrc: Temporal  SpO2: 96%  Weight: 180 lb (81.6 kg)  Height: 6' (1.829 m)   Body mass index is  24.41 kg/m. Physical Exam Vitals reviewed.  Constitutional:      Appearance: Normal appearance.  HENT:     Head: Normocephalic.     Nose: Nose normal.     Mouth/Throat:     Mouth: Mucous membranes are moist.     Pharynx: Oropharynx is clear.  Eyes:     Pupils: Pupils are equal, round, and reactive to light.  Cardiovascular:     Rate and Rhythm: Normal rate and regular rhythm.     Pulses: Normal pulses.     Heart sounds: No murmur heard. Pulmonary:     Effort: Pulmonary effort is normal. No respiratory distress.     Breath sounds: Normal breath sounds. No rales.  Abdominal:     General: Abdomen is flat. Bowel sounds are normal.     Palpations: Abdomen is soft.  Musculoskeletal:        General: Swelling present.     Cervical back: Neck supple.     Comments: Mild edema  Skin:    General: Skin is warm.  Neurological:     General: No focal deficit present.     Mental Status: He is alert and oriented to person, place, and time.  Psychiatric:        Mood and Affect: Mood normal.        Thought Content: Thought content normal.    Labs reviewed: Basic Metabolic Panel: Recent Labs    07/03/22 0830  NA 139  K 4.3  CL 108  CO2 22  GLUCOSE 83  BUN 21  CREATININE 0.86  CALCIUM 8.9  TSH 1.46   Liver Function Tests: Recent Labs    07/03/22 0830  AST 17  ALT 10  BILITOT 0.7  PROT 6.7   No results for input(s): "LIPASE", "AMYLASE" in the last 8760 hours. No results for input(s): "AMMONIA" in the last 8760 hours. CBC: Recent Labs    07/03/22 0830  WBC 6.4  NEUTROABS 3,565  HGB 14.1  HCT 42.1  MCV 100.2*  PLT 247   Lipid Panel: Recent Labs    07/03/22 0830  CHOL 181  HDL 52  LDLCALC 110*  TRIG 94  CHOLHDL 3.5   No results found for: "HGBA1C"  Procedures since last visit: No results found.  Assessment/Plan 1. Bilateral leg edema Use Ted hose Prn Doppler negative  2. Depression, recurrent (HCC) Doing well on lexpro  3. Irritable bowel  syndrome with diarrhea Does not want to see GI Will let me know if anything changes  4. Mixed hyperlipidemia Repeat Lipid Dietary control  5. Malignant neoplasm of urinary bladder, unspecified site [facility]) Follows with Urology  6. Sensorineural hearing loss (SNHL) of left ear, unspecified hearing status on contralateral side Has Hearing aid  7. Chronic bilateral low back pain without sciatica Tylenol  8. Other cough ? Post nasal drip Exam was normal He will  let me know if any worsening Possible chest Xray  9. Chronic right shoulder pain Wants to wait till he sees Ortho for another Steroid injection     Labs/tests ordered:   Next appt:  Visit date not found

## 2023-01-02 DIAGNOSIS — R600 Localized edema: Secondary | ICD-10-CM | POA: Insufficient documentation

## 2023-01-02 DIAGNOSIS — F339 Major depressive disorder, recurrent, unspecified: Secondary | ICD-10-CM | POA: Insufficient documentation

## 2023-06-11 ENCOUNTER — Other Ambulatory Visit (HOSPITAL_BASED_OUTPATIENT_CLINIC_OR_DEPARTMENT_OTHER): Payer: Self-pay | Admitting: Physician Assistant

## 2023-06-11 DIAGNOSIS — T1490XA Injury, unspecified, initial encounter: Secondary | ICD-10-CM

## 2023-06-12 ENCOUNTER — Ambulatory Visit (HOSPITAL_BASED_OUTPATIENT_CLINIC_OR_DEPARTMENT_OTHER)

## 2023-06-25 ENCOUNTER — Other Ambulatory Visit: Payer: Medicare Other

## 2023-06-30 LAB — CBC WITH DIFFERENTIAL/PLATELET
Absolute Lymphocytes: 2018 {cells}/uL (ref 850–3900)
Absolute Monocytes: 400 {cells}/uL (ref 200–950)
Basophils Absolute: 41 {cells}/uL (ref 0–200)
Basophils Relative: 0.7 %
Eosinophils Absolute: 232 {cells}/uL (ref 15–500)
Eosinophils Relative: 4 %
HCT: 42.1 % (ref 38.5–50.0)
Hemoglobin: 14.1 g/dL (ref 13.2–17.1)
MCH: 34.4 pg — ABNORMAL HIGH (ref 27.0–33.0)
MCHC: 33.5 g/dL (ref 32.0–36.0)
MCV: 102.7 fL — ABNORMAL HIGH (ref 80.0–100.0)
MPV: 10.5 fL (ref 7.5–12.5)
Monocytes Relative: 6.9 %
Neutro Abs: 3109 {cells}/uL (ref 1500–7800)
Neutrophils Relative %: 53.6 %
Platelets: 232 10*3/uL (ref 140–400)
RBC: 4.1 10*6/uL — ABNORMAL LOW (ref 4.20–5.80)
RDW: 12.2 % (ref 11.0–15.0)
Total Lymphocyte: 34.8 %
WBC: 5.8 10*3/uL (ref 3.8–10.8)

## 2023-06-30 LAB — COMPREHENSIVE METABOLIC PANEL
AG Ratio: 1.4 (calc) (ref 1.0–2.5)
ALT: 12 U/L (ref 9–46)
AST: 17 U/L (ref 10–35)
Albumin: 3.9 g/dL (ref 3.6–5.1)
Alkaline phosphatase (APISO): 97 U/L (ref 35–144)
BUN: 19 mg/dL (ref 7–25)
CO2: 22 mmol/L (ref 20–32)
Calcium: 8.8 mg/dL (ref 8.6–10.3)
Chloride: 105 mmol/L (ref 98–110)
Creat: 0.9 mg/dL (ref 0.70–1.22)
Globulin: 2.8 g/dL (ref 1.9–3.7)
Glucose, Bld: 137 mg/dL — ABNORMAL HIGH (ref 65–99)
Potassium: 4 mmol/L (ref 3.5–5.3)
Sodium: 135 mmol/L (ref 135–146)
Total Bilirubin: 1 mg/dL (ref 0.2–1.2)
Total Protein: 6.7 g/dL (ref 6.1–8.1)
eGFR: 83 mL/min/{1.73_m2} (ref >=60)

## 2023-06-30 LAB — LIPID PANEL
Cholesterol: 167 mg/dL (ref <200)
HDL: 52 mg/dL (ref >=40)
LDL Cholesterol (Calc): 97 mg/dL
Non-HDL Cholesterol (Calc): 115 mg/dL (ref <130)
Total CHOL/HDL Ratio: 3.2 (calc) (ref <5.0)
Triglycerides: 88 mg/dL (ref <150)

## 2023-06-30 LAB — TSH: TSH: 1.52 m[IU]/L (ref 0.40–4.50)

## 2023-07-01 ENCOUNTER — Non-Acute Institutional Stay: Payer: Medicare Other | Admitting: Internal Medicine

## 2023-07-01 ENCOUNTER — Encounter: Payer: Self-pay | Admitting: Internal Medicine

## 2023-07-01 VITALS — BP 127/68 | HR 73 | Temp 97.3°F | Resp 18 | Ht 72.0 in | Wt 190.0 lb

## 2023-07-01 DIAGNOSIS — R739 Hyperglycemia, unspecified: Secondary | ICD-10-CM

## 2023-07-01 DIAGNOSIS — E782 Mixed hyperlipidemia: Secondary | ICD-10-CM

## 2023-07-01 DIAGNOSIS — R600 Localized edema: Secondary | ICD-10-CM

## 2023-07-01 DIAGNOSIS — N3945 Continuous leakage: Secondary | ICD-10-CM

## 2023-07-01 DIAGNOSIS — F339 Major depressive disorder, recurrent, unspecified: Secondary | ICD-10-CM | POA: Diagnosis not present

## 2023-07-01 NOTE — Patient Instructions (Signed)
 Come Fasting before next Blood Test

## 2023-07-05 NOTE — Progress Notes (Unsigned)
 [facility]  [facility] ([facility])  Provider:   Code Status:  Goals of Care:     12/31/2022   10:52 AM  Advanced Directives  Does Patient Have a Medical Advance Directive? Yes  Type of Estate agent of [place];Living will;Out of facility DNR (pink MOST or yellow form)  Does patient want to make changes to medical advance directive? No - Patient declined  Copy of Healthcare Power of Attorney in Chart? Yes - validated most recent copy scanned in chart (See row information)     Chief Complaint  Patient presents with   Medical Management of Chronic Issues    6 month follow up     HPI: Patient is a 89 y.o. male seen today for medical management of chronic diseases.   [place] [facility] Lost Wife almost year ago   Discussed the use of AI scribe software for clinical note transcription with the patient, who gave verbal consent to proceed.  History of Present Illness   The patient, with a history of bladder presents with bladder incontinence  The patient reports that the bladder incontinence is worse during the day, with frequent leakage that requires the use of padding. The patient denies any issues with incontinence at night.   The patient also reports a recent fall where he tripped on the sidewalk, resulting in two fractured ribs. The patient did not seek immediate medical attention but went to urgent care the next day. The patient believes his trifocal glasses may have contributed to the fall.  The patient also discusses his mood management with Lexapro, stating that it seems to be helping with his depression. The patient also takes Xanax occasionally for anxiety, as recommended by his therapist. The patient reports feeling stable with his current mental health management.  The patient also mentions issues with his hearing aids, questioning the cost-effectiveness of the device. The patient reports that he does not notice a  significant improvement in his hearing with the aids.   The patient also mentions a rotator cuff issue, which is tender but does not significantly hamper his movement.      Acute issues   Anxiety with depression Sees Therapist Stable on Lexapro   H/o Urinary incontinence after undergoing Bladder cancer Treatment   Right Shoulder pain Better after injection from Ortho   IBS with Loose stools   Bilateral Leg swelling Mild Dopplers negative  Past Medical History:  Diagnosis Date   Anxiety    Arthritis    Cancer (HCC)    Depression    Pneumonia    Prostate cancer [facility])     Past Surgical History:  Procedure Laterality Date   APPENDECTOMY  1951   CHOLECYSTECTOMY     2006   HERNIA REPAIR  1951   INSERTION PROSTATE RADIATION SEED  2005   MENISCUS REPAIR  1995   TONSILECTOMY, ADENOIDECTOMY, BILATERAL MYRINGOTOMY AND TUBES  1957   trans-urethral resection of prostate  1999, 2003   TRANSURETHRAL RESECTION OF BLADDER TUMOR WITH MITOMYCIN-C N/A 01/08/2021   Procedure: TRANSURETHRAL RESECTION OF BLADDER TUMOR WITH GEMCITABINE;  Surgeon: Bjorn Pippin, MD;  [facility];  Service: Urology;  Laterality: N/A;   WRIST SURGERY  April and July 2021    Allergies  Allergen Reactions   Prednisone Anxiety    Outpatient Encounter Medications as of 07/01/2023  Medication Sig   Cholecalciferol (VITAMIN D) 50 MCG (2000 UT) tablet Take 1 tablet (2,000  Units total) by mouth daily.   escitalopram (LEXAPRO) 20 MG tablet Take 20 mg by mouth daily.   Facility-Administered Encounter Medications as of 07/01/2023  Medication   gemcitabine (GEMZAR) chemo syringe for bladder instillation 2,000 mg    Review of Systems:  Review of Systems  Constitutional:  Negative for activity change, appetite change and unexpected weight change.  HENT: Negative.    Respiratory:  Negative for cough and shortness of breath.   Cardiovascular:  Negative for leg swelling.  Gastrointestinal:  Negative for  constipation.  Genitourinary:  Negative for frequency.  Musculoskeletal:  Positive for arthralgias. Negative for gait problem and myalgias.  Skin: Negative.  Negative for rash.  Neurological:  Negative for dizziness and weakness.  Psychiatric/Behavioral:  Negative for confusion and sleep disturbance.   All other systems reviewed and are negative.   Health Maintenance  Topic Date Due   Medicare Annual Wellness (AWV)  09/17/2022   INFLUENZA VACCINE  11/06/2022   COVID-19 Vaccine (6 - 2024-25 season) 12/07/2022   DTaP/Tdap/Td (4 - Td or Tdap) 08/08/2029   Pneumonia Vaccine 4+ Years old  Completed   Zoster Vaccines- Shingrix  Completed   HPV VACCINES  Aged Out    Physical Exam: Vitals:   07/01/23 1338  BP: 127/68  Pulse: 73  Resp: 18  Temp: (!) 97.3 F (36.3 C)  SpO2: 99%  Weight: 190 lb (86.2 kg)  Height: 6' (1.829 m)   Body mass index is 25.77 kg/m. Physical Exam Vitals reviewed.  Constitutional:      Appearance: Normal appearance.  HENT:     Head: Normocephalic.     Nose: Nose normal.     Mouth/Throat:     Mouth: Mucous membranes are moist.     Pharynx: Oropharynx is clear.  Eyes:     Pupils: Pupils are equal, round, and reactive to light.  Cardiovascular:     Rate and Rhythm: Normal rate and regular rhythm.     Pulses: Normal pulses.     Heart sounds: No murmur heard. Pulmonary:     Effort: Pulmonary effort is normal. No respiratory distress.     Breath sounds: Normal breath sounds. No rales.  Abdominal:     General: Abdomen is flat. Bowel sounds are normal.     Palpations: Abdomen is soft.  Musculoskeletal:        General: No swelling.     Cervical back: Neck supple.  Skin:    General: Skin is warm.  Neurological:     General: No focal deficit present.     Mental Status: He is alert and oriented to person, place, and time.  Psychiatric:        Mood and Affect: Mood normal.        Thought Content: Thought content normal.     Labs reviewed: Basic  Metabolic Panel: Recent Labs    06/29/23 0754  NA 135  K 4.0  CL 105  CO2 22  GLUCOSE 137*  BUN 19  CREATININE 0.90  CALCIUM 8.8  TSH 1.52   Liver Function Tests: Recent Labs    06/29/23 0754  AST 17  ALT 12  BILITOT 1.0  PROT 6.7   No results for input(s): "LIPASE", "AMYLASE" in the last 8760 hours. No results for input(s): "AMMONIA" in the last 8760 hours. CBC: Recent Labs    06/29/23 0754  WBC 5.8  NEUTROABS 3,109  HGB 14.1  HCT 42.1  MCV 102.7*  PLT 232   Lipid Panel: Recent  Labs    06/29/23 0754  CHOL 167  HDL 52  LDLCALC 97  TRIG 88  CHOLHDL 3.2   No results found for: "HGBA1C"  Procedures since last visit: No results found.  Assessment/Plan 1. Depression, recurrent (HCC) (Primary) Sees Psych On Lexapro Has Prescription for Xanax but does not uses it  2. Mixed hyperlipidemia Diet Modification  3. Bilateral leg edema Stable  4. Continuous leakage of urine  Chronic incontinence with daytime leakage, likely structural. Prefers conservative management. - Continue management with padding and lifestyle adjustments.  5 Bladder cancer Treated with intravesical therapy. No recurrence symptoms. - Continue regular follow-up with oncologist.  .6 Rotator cuff tendinopathy Chronic right shoulder pain, improved with previous cortisone injection. Pain not significantly impacting daily activities. - Consider cortisone injection if symptoms worsen. - Avoid movements that exacerbate pain.  7 Vision issues Tripping incident possibly related to trifocal glasses. Managing with current eyewear. - Consider multifocal glasses if issues persist.  Hearing loss Severe bilateral hearing loss in upper ranges. Using hearing aids with perceived limited benefit. Discussed dementia prevention and social engagement benefits. - Continue using current hearing aids. - Research cost-effective alternatives if dissatisfaction persists.   Goals of Care Expresses  isolation and guilt post-spouse loss. Values independence and conservative management. - Encourage social engagement and support networks.  Follow-up Routine follow-up in six months. Recent non-fasting blood work may have affected results. - Schedule follow-up in six months. - Ensure fasting for next blood work. - Check vitamin B12 levels at next visit.        Labs/tests ordered:  * No order type specified * Next appt:  Visit date not found

## 2023-11-02 ENCOUNTER — Ambulatory Visit: Payer: Self-pay

## 2023-11-02 ENCOUNTER — Telehealth: Payer: Self-pay | Admitting: *Deleted

## 2023-11-02 NOTE — Telephone Encounter (Signed)
 Copied from CRM [id number]. Topic: Clinical - Request for Lab/Test Order >> Nov 02, 2023  1:27 PM Marda MATSU wrote: Patient Lee Oconnell called he stated his therapy is not improving his lumbar pain, the therapist recommends that he have an MRI.  Please advise

## 2023-11-02 NOTE — Telephone Encounter (Signed)
 Cloria Pellet, RN to [facility]  (Selected Message)     11/02/23  3:38 PM Apt tomorrow

## 2023-11-02 NOTE — Telephone Encounter (Signed)
  FYI Only or Action Required?: FYI only for provider.  Patient was last seen in primary care on 07/01/2023 by Charlanne Fredia CROME, MD.  Called Nurse Triage reporting lumbar pain.  Symptoms began several months ago.  Interventions attempted: Nothing.  Symptoms are: unchanged.  Triage Disposition: See PCP When Office is Open (Within 3 Days)  Patient/caregiver understands and will follow disposition?: Yes   Copied from CRM [id number]. Topic: Clinical - Red Word Triage >> Nov 02, 2023  3:28 PM Zane F wrote: Red Word that prompted transfer to Nurse Triage:   Patient is experiencing lumbar pain that has increased over time but he has not heard back from the office. Reason for Disposition  [1] MODERATE back pain (e.g., interferes with normal activities) AND [2] present > 3 days  Answer Assessment - Initial Assessment Questions 1. ONSET: When did the pain begin? (e.g., minutes, hours, days)     Many months 2. LOCATION: Where does it hurt? (upper, mid or lower back)     Lumbar region, has done OT/ physical therapy, pool and massage, electrical stimuli  3. SEVERITY: How bad is the pain?  (e.g., Scale 1-10; mild, moderate, or severe)     Varies mild to severe 4. PATTERN: Is the pain constant? (e.g., yes, no; constant, intermittent)      Comes and goes 5. RADIATION: Does the pain shoot into your legs or somewhere else?     denies 6. CAUSE:  What do you think is causing the back pain?      uknonwn 7. BACK OVERUSE:  Any recent lifting of heavy objects, strenuous work or exercise?     denies 8. MEDICINES: What have you taken so far for the pain? (e.g., nothing, acetaminophen , NSAIDS)     denies 9. NEUROLOGIC SYMPTOMS: Do you have any weakness, numbness, or problems with bowel/bladder control?     denies 10. OTHER SYMPTOMS: Do you have any other symptoms? (e.g., fever, abdomen pain, burning with urination, blood in urine)       Back cramping 11. PREGNANCY: Is there  any chance you are pregnant? When was your last menstrual period?       na  Protocols used: Back Pain-A-AH

## 2023-11-02 NOTE — Telephone Encounter (Signed)
 Copied from CRM [id number]. Topic: Clinical - Request for Lab/Test Order >> Nov 02, 2023  1:27 PM Marda MATSU wrote: Patient Lee Oconnell called he stated his therapy is not improving his lumbar pain, the therapist recommends that he have an MRI.  Please advise >> Nov 02, 2023  3:30 PM Zane F wrote: Patient is calling back and stating he has not received the callback requested this morning. Patient was warm transferred to NT.   Callback Number: [phone]

## 2023-11-02 NOTE — Telephone Encounter (Signed)
 Patient scheduled an appointment for evaluation by E2C2 for tomorrow 7/29

## 2023-11-02 NOTE — Telephone Encounter (Signed)
 Patient scheduled for an appointment to follow up  lumbar pain 11/03/2023

## 2023-11-03 ENCOUNTER — Encounter: Payer: Self-pay | Admitting: Family

## 2023-11-03 ENCOUNTER — Ambulatory Visit (INDEPENDENT_AMBULATORY_CARE_PROVIDER_SITE_OTHER): Admitting: Family

## 2023-11-03 ENCOUNTER — Ambulatory Visit
Admission: RE | Admit: 2023-11-03 | Discharge: 2023-11-03 | Disposition: A | Discharge status: Home / Self Care | Source: Ambulatory Visit | Attending: Family | Admitting: Family

## 2023-11-03 VITALS — BP 134/80 | HR 85 | Temp 97.8°F | Resp 18 | Ht 72.0 in | Wt 190.8 lb

## 2023-11-03 DIAGNOSIS — M545 Low back pain, unspecified: Secondary | ICD-10-CM | POA: Diagnosis not present

## 2023-11-03 DIAGNOSIS — M25552 Pain in left hip: Secondary | ICD-10-CM

## 2023-11-03 DIAGNOSIS — G8929 Other chronic pain: Secondary | ICD-10-CM

## 2023-11-03 MED ORDER — LIDOCAINE 5 % EX PTCH: 1.0000 | MEDICATED_PATCH | CUTANEOUS | 3 refills | Status: DC

## 2023-11-03 MED ORDER — CYCLOBENZAPRINE HCL 10 MG PO TABS: 10.0000 mg | ORAL_TABLET | Freq: Three times a day (TID) | ORAL | 0 refills | Status: DC | PRN

## 2023-11-03 NOTE — Patient Instructions (Signed)
-   Please get lower back and left hip X-ray at [facility] imaging at [facility] then will call you with results.

## 2023-11-04 ENCOUNTER — Telehealth: Payer: Self-pay

## 2023-11-04 NOTE — Telephone Encounter (Signed)
 Incoming fax received from patients pharmacy to initiate a prior authorization for                   .  PA initiated through covermymeds. Key: [id number]  Awaiting reply from the insurance company which will be determined in 48-72 hours.

## 2023-11-05 ENCOUNTER — Telehealth: Payer: Self-pay

## 2023-11-05 NOTE — Telephone Encounter (Signed)
-   Left hip x-ray showed no acute fracture or dislocation.  Moderate bilateral hip arthritic changes were noted. -   Lumbar spine x-ray showed no acute findings.  Multilevel degenerative changes (natural wear and tear). -   Continue cyclobenzaprine  10 mg 3 times a day as needed and lidocaine  5% patch transdermally daily.

## 2023-11-05 NOTE — Telephone Encounter (Signed)
 Outgoing call placed to radiology reading room [phone], spoke with Gordy and he will have the radiologist prioritize reading patients results as they are not back yet.

## 2023-11-05 NOTE — Telephone Encounter (Signed)
 Copied from CRM [id number]. Topic: Clinical - Lab/Test Results >> Nov 05, 2023  2:30 PM Farrel B wrote: Reason for CRM: [id number] (M)patient called in regards to the imaging that was done on 11/02/23, patient stated that he is in extreme pain, I offered to get him over the NT, he stated he wanted the results so he can know what's going on, he needed to figure out what form of treatment he needed, he stated he didn't like the muscle relaxer that was give to him because it made him feel out of this world and not in a good way. I advised the patient depending the radiologist reading the images it can be 24-48 hours for results. Please call patient to advise of any updated information.

## 2023-11-06 NOTE — Telephone Encounter (Signed)
 Patient called the office back and states that he didn't do well with Cyclobenzaprine , and Lidocaine  was $40 because insurance wasn't going to cover it. He states that his next step would be to go back to Orthopedic Dr [facility]) for further evaluation. He wants to know what you think should be done. Message routed back to Jereld Delude, NP.

## 2023-11-06 NOTE — Telephone Encounter (Signed)
 Please clarify if you're agreeable with patient going to Orthopedic Dr for further evaluation? Message routed back to Jereld Delude, NP

## 2023-11-06 NOTE — Telephone Encounter (Signed)
 Left message on voicemail for patient to return call when available . Reason for call: Share providers response below. S.Chrae (sh-ray)/CMA

## 2023-11-06 NOTE — Telephone Encounter (Signed)
 Biofreeze gel can be applied on painful area 4X/day, heating pad can be used

## 2023-11-08 ENCOUNTER — Other Ambulatory Visit: Payer: Self-pay | Admitting: Adult Health

## 2023-11-08 DIAGNOSIS — G8929 Other chronic pain: Secondary | ICD-10-CM

## 2023-11-08 NOTE — Telephone Encounter (Signed)
Orthopedic referral sent.

## 2023-11-08 NOTE — Progress Notes (Signed)
 Provider: Haevyn Ury FNP-C  Charlanne Fredia CROME, MD  Patient Care Team: Charlanne Fredia CROME, MD as PCP - General (Internal Medicine)  Extended Emergency Contact Information Primary Emergency Contact: [name],[name] Home Phone: [phone] Mobile Phone: [phone] Relation: Son Secondary Emergency Contact: [name],[name]  [place] Work Phone: [phone] Mobile Phone: [phone] Relation: Daughter  Code Status:  Full Code  Goals of care: Advanced Directive information    11/03/2023    2:21 PM  Advanced Directives  Does Patient Have a Medical Advance Directive? Yes  Type of Estate agent of [place];Living will  Does patient want to make changes to medical advance directive? No - Patient declined  Copy of Healthcare Power of Attorney in Chart? Yes - validated most recent copy scanned in chart (See row information)     Chief Complaint  Patient presents with   Pain    Lumbar pain    Discussed the use of AI scribe software for clinical note transcription with the patient, who gave verbal consent to proceed.  History of Present Illness   Lee Oconnell is an 89 year old male with arthritis who presents with worsening left hip pain.  He has been experiencing severe pain in the left hip area for several months, which has progressively worsened. The pain is described as excruciating spasms that occur with certain movements and while walking. Sitting and lying flat in bed are sometimes uncomfortable, but he does not experience pain when lying flat. The pain is localized to one spot on the left side and does not radiate down the leg or up the spine.  He previously consulted a physician who suggested the pain was muscular rather than bone-related and recommended therapy. He underwent a month of occupational therapy, including medication, massage, manipulation, water therapy, ultrasound, and electric stimulation, but these treatments have not  improved his condition. In fact, he feels the pain has worsened.  He has been using ice packs and Icy Hot ointment several times a day without relief. He has a history of arthritis, which he describes as crippling, and was advised to use heat for arthritis but has stopped using a heating pad as it did not alleviate the hip pain.  He was prescribed anti-inflammatory medication and a muscle relaxant, which he cannot recall the name of, but these did not alleviate his symptoms. He has not taken over-the-counter medications like Tylenol , as he doubts their efficacy given the intensity of his pain.  He has [facility] with [facility] for about nine years. He is able to drive himself, although he finds getting in and out of the car challenging due to the pain. He can sit and stand but experiences severe pain with sudden movements. No radiation of pain down the leg or up the spine, weakness, numbness, or tremors in the leg. No pain when lying flat in bed.   Past Medical History:  Diagnosis Date   Anxiety    Arthritis    Cancer (HCC)    Depression    Pneumonia    Prostate cancer [facility])    Past Surgical History:  Procedure Laterality Date   APPENDECTOMY  1951   CHOLECYSTECTOMY     2006   HERNIA REPAIR  1951   INSERTION PROSTATE RADIATION SEED  2005   MENISCUS REPAIR  1995   TONSILECTOMY, ADENOIDECTOMY, BILATERAL MYRINGOTOMY AND TUBES  1957   trans-urethral resection of prostate  1999, 2003   TRANSURETHRAL RESECTION OF BLADDER TUMOR WITH MITOMYCIN -C N/A 01/08/2021  Procedure: TRANSURETHRAL RESECTION OF BLADDER TUMOR WITH GEMCITABINE ;  Surgeon: Watt Rush, MD;  Location: [facility];  Service: Urology;  Laterality: N/A;   WRIST SURGERY  April and July 2021    Allergies  Allergen Reactions   Prednisone Anxiety    Outpatient Encounter Medications as of 11/03/2023  Medication Sig   ALPRAZolam  (XANAX ) 0.25 MG tablet Take 0.25 mg by mouth as needed.   Cholecalciferol (VITAMIN D ) 50 MCG (2000 UT)  tablet Take 1 tablet (2,000 Units total) by mouth daily.   cyclobenzaprine  (FLEXERIL ) 10 MG tablet Take 1 tablet (10 mg total) by mouth 3 (three) times daily as needed for muscle spasms.   escitalopram (LEXAPRO) 20 MG tablet Take 20 mg by mouth daily. (Patient taking differently: Take 5 mg by mouth daily.)   lidocaine  (LIDODERM ) 5 % Place 1 patch onto the skin daily. Remove & Discard patch within 12 hours or as directed by MD   Facility-Administered Encounter Medications as of 11/03/2023  Medication   gemcitabine  (GEMZAR ) chemo syringe for bladder instillation 2,000 mg    Review of Systems  Constitutional:  Negative for appetite change, chills, fatigue, fever and unexpected weight change.  HENT:  Negative for congestion, dental problem, ear discharge, ear pain, facial swelling, hearing loss, nosebleeds, postnasal drip, rhinorrhea, sinus pressure, sinus pain, sneezing, sore throat and tinnitus.   Respiratory:  Negative for cough, chest tightness, shortness of breath and wheezing.   Cardiovascular:  Negative for chest pain, palpitations and leg swelling.  Gastrointestinal:  Negative for abdominal distention, abdominal pain, blood in stool, constipation, diarrhea, nausea and vomiting.  Genitourinary:  Negative for difficulty urinating, dysuria, flank pain, frequency and urgency.  Musculoskeletal:  Positive for arthralgias and back pain. Negative for gait problem, joint swelling, myalgias, neck pain and neck stiffness.       Left hip   Skin:  Negative for color change, pallor and rash.  Neurological:  Negative for dizziness, weakness, light-headedness, numbness and headaches.    Immunization History  Administered Date(s) Administered   Fluad Quad(high Dose 65+) 02/01/2021, 01/22/2022   Influenza, High Dose Seasonal PF 01/16/2016, 01/21/2017, 01/28/2018   Influenza-Unspecified 01/28/2018, 01/19/2019   Moderna Covid-19 Fall Seasonal Vaccine 42yrs & older 02/11/2022   Moderna Covid-19 Vaccine  Bivalent Booster 28yrs & up 01/22/2021, 09/18/2021   Moderna SARS-COV2 Booster Vaccination 02/20/2020, 09/12/2020   Moderna Sars-Covid-2 Vaccination 04/11/2019, 05/09/2019   Pneumococcal Conjugate-13 05/14/2016   Pneumococcal Polysaccharide-23 05/19/2012   Tdap 08/06/2005, 01/27/2019, 08/09/2019   Unspecified SARS-COV-2 Vaccination 02/05/2021   Zoster Recombinant(Shingrix) 09/10/2017, 12/19/2017   Zoster, Live 08/27/2005   Pertinent  Health Maintenance Due  Topic Date Due   INFLUENZA VACCINE  11/06/2023      03/05/2022    8:55 AM 06/25/2022   10:51 AM 12/31/2022   10:52 AM 07/01/2023    1:36 PM 11/03/2023    2:20 PM  Fall Risk  Falls in the past year? 0 1 0 1 1  Was there an injury with Fall? 0 0 0 1 1  Fall Risk Category Calculator 0 1 0 3 2  Fall Risk Category (Retired) Low       (RETIRED) Patient Fall Risk Level Low fall risk       Patient at Risk for Falls Due to No Fall Risks History of fall(s) No Fall Risks No Fall Risks No Fall Risks  Fall risk Follow up Falls evaluation completed  Falls evaluation completed Falls evaluation completed Falls evaluation completed Falls evaluation completed  Data saved with a previous flowsheet row definition   Functional Status Survey:    Vitals:   11/03/23 1425  BP: 134/80  Pulse: 85  Resp: 18  Temp: 97.8 F (36.6 C)  SpO2: 97%  Weight: 190 lb 12.8 oz (86.5 kg)  Height: 6' (1.829 m)   Body mass index is 25.88 kg/m. Physical Exam  GENERAL: Alert, cooperative, well developed, no acute distress HEENT: Normocephalic, normal oropharynx, moist mucous membranes CHEST: Clear to auscultation bilaterally, no wheezes, rhonchi, or crackles CARDIOVASCULAR: Normal heart rate and rhythm, S1 and S2 normal without murmurs ABDOMEN: Soft, non-tender, non-distended, without organomegaly, normal bowel sounds EXTREMITIES: No cyanosis or edema MUSCULOSKELETAL: Tenderness in left hip area, no swelling or bruising, no tenderness near  spine NEUROLOGICAL: Cranial nerves grossly intact, moves all extremities without gross motor or sensory deficit      Labs reviewed: Recent Labs    06/29/23 0754  NA 135  K 4.0  CL 105  CO2 22  GLUCOSE 137*  BUN 19  CREATININE 0.90  CALCIUM 8.8   Recent Labs    06/29/23 0754  AST 17  ALT 12  BILITOT 1.0  PROT 6.7   Recent Labs    06/29/23 0754  WBC 5.8  NEUTROABS 3,109  HGB 14.1  HCT 42.1  MCV 102.7*  PLT 232   Lab Results  Component Value Date   TSH 1.52 06/29/2023   No results found for: HGBA1C Lab Results  Component Value Date   CHOL 167 06/29/2023   HDL 52 06/29/2023   LDLCALC 97 06/29/2023   TRIG 88 06/29/2023   CHOLHDL 3.2 06/29/2023    Significant Diagnostic Results in last 30 days:  DG Hip Unilat W OR W/O Pelvis 2-3 Views Left Result Date: 11/05/2023 CLINICAL DATA:  Left hip pain. EXAM: DG HIP (WITH OR WITHOUT PELVIS) 2-3V LEFT COMPARISON:  None Available. FINDINGS: No acute fracture or dislocation. The bones are osteopenic. Moderate bilateral hip arthritic changes. The soft tissues are unremarkable. IMPRESSION: 1. No acute fracture or dislocation. 2. Moderate bilateral hip arthritic changes. Electronically Signed   By: Vanetta Chou M.D.   On: 11/05/2023 16:23   DG Lumbar Spine Complete Result Date: 11/05/2023 CLINICAL DATA:  Low back pain. EXAM: LUMBAR SPINE - COMPLETE 4+ VIEW COMPARISON:  None Available. FINDINGS: There is no acute fracture or subluxation of the lumbar spine. The bones are osteopenic. Multilevel degenerative changes and facet arthropathy. The soft tissues are unremarkable. Right upper quadrant cholecystectomy clips. IMPRESSION: 1. No acute findings. 2. Multilevel degenerative changes. Electronically Signed   By: Vanetta Chou M.D.   On: 11/05/2023 16:22    Assessment/Plan  Left hip pain with muscle spasm and suspected osteoarthritis Chronic left hip pain with muscle spasms, worsening over months. Pain is excruciating with  certain movements, localized to the left hip beneath the pelvic bone. No radiation, weakness, or numbness. Pain absent when lying flat. Previous occupational therapy and treatments ineffective. Differential includes muscle spasm, osteoarthritis, or other musculoskeletal issues. Imaging needed to rule out arthritis, fracture, or other conditions. - Order x-ray of the lower back and left hip at [facility]. - Prescribe muscle relaxant as needed, cautioning against driving due to potential drowsiness. - Prescribe Lidoderm  patch for localized pain relief. - Advise continuation of hydrotherapy and massage if muscle spasm is confirmed. - Consider referral to orthopedist if imaging suggests structural issues. - Discuss potential for cortisone injection if indicated by imaging results.   Family/ staff Communication: Reviewed plan  of care with patient verbalized understanding   Labs/tests ordered: None   Next Appointment:Return if symptoms worsen or fail to improve.    Total time: 20 minutes. Greater than 50% of total time spent doing patient education regarding left hip pain and health maintenance including symptom/medication management.   Roxan JAYSON Plough, NP

## 2023-11-09 ENCOUNTER — Ambulatory Visit: Payer: Self-pay | Admitting: Adult Health

## 2023-11-09 NOTE — Progress Notes (Signed)
 referred to orthopedic.

## 2023-11-09 NOTE — Telephone Encounter (Signed)
 Patient notified and agreed.

## 2023-11-09 NOTE — Telephone Encounter (Signed)
 Message routed to clinical intake for today Donzell.M/CMA.

## 2023-11-09 NOTE — Progress Notes (Signed)
Referred to orthopedic

## 2023-11-12 ENCOUNTER — Telehealth: Payer: Self-pay

## 2023-11-12 ENCOUNTER — Other Ambulatory Visit: Payer: Self-pay | Admitting: Orthopedic Surgery

## 2023-11-12 DIAGNOSIS — M545 Low back pain, unspecified: Secondary | ICD-10-CM

## 2023-11-12 NOTE — Telephone Encounter (Signed)
 I left a detailed voicemail informing patient referral order placed

## 2023-11-12 NOTE — Telephone Encounter (Signed)
 Patient called stating that he has an ongoing back problem, seen [facility], therapy and medications are ineffective. Patient states  The problem is actually getting worse. Patient was recently giving 2 steroid injections and they did absolutely nothing.  Patients friends and family have urged him to get a second opinion from another specialist. Patient is questioning if a referral or order can be placed for him to see someone else about his problem

## 2023-11-12 NOTE — Telephone Encounter (Signed)
 Patient was last seen by Dinah on 11/03/23, however she has advised that message be routed

## 2023-11-12 NOTE — Telephone Encounter (Signed)
 Please send message to covering provider

## 2023-11-12 NOTE — Telephone Encounter (Signed)
 Recommend neurosurgery evaluation. Referral made to [facility].

## 2023-11-13 ENCOUNTER — Telehealth: Payer: Self-pay

## 2023-11-13 NOTE — Telephone Encounter (Signed)
 Copied from CRM #[id number]. Topic: Referral - Status >> Nov 13, 2023  1:46 PM Alfonso ORN wrote: Reason for CRM: patient checking on status of referral  spoke Cherie yesterday 11/12/23  regarding making  arrangement to set up  an appointment with a [facility] neuro surgeon  patient call  [phone]

## 2023-11-16 NOTE — Telephone Encounter (Signed)
 I have spoken to Lee Oconnell and let him know I got his message and we will wait to follow up after his appointment at [facility].

## 2023-11-17 ENCOUNTER — Ambulatory Visit: Payer: Self-pay

## 2023-11-17 NOTE — Telephone Encounter (Signed)
 FYI Only or Action Required?: Action required by provider: clinical question for provider and update on patient condition. Son calling to ask if PCP can order stat MRI today.   Patient was last seen in primary care on 11/03/2023 by Ngetich, Roxan BROCKS, NP.  Called Nurse Triage reporting Back Pain.  Symptoms began several months ago.  Interventions attempted: Nothing.  Symptoms are: gradually worsening.  Triage Disposition: See HCP Within 4 Hours (Or PCP Triage)  Patient/caregiver understands and will follow disposition?: Unsure  Copied from CRM #[id number]. Topic: Clinical - Red Word Triage >> Nov 17, 2023  9:24 AM Zane F wrote: [facility] that prompted transfer to Nurse Triage:    Intense back pain; unable to move  Patient at [facility] Reason for Disposition  [1] SEVERE back pain (e.g., excruciating, unable to do any normal activities) AND [2] not improved 2 hours after pain medicine  Answer Assessment - Initial Assessment Questions 1. ONSET: When did the pain begin? (e.g., minutes, hours, days)     ongoing 2. LOCATION: Where does it hurt? (upper, mid or lower back)     Lower back  Son calling on behalf of pt, states he received a phone call this morning from his care facility that the pt could not stand/move last night d/t pain in his back. Son unsure if pt is still unable to stand/walk, RN advised pt needs ED eval if pt is not able to stand/walk at this time. Son states this is an ongoing issue, with the inability to stand/walk a progression of s/s. Pt was referred to ortho where they have scheduled an MRI for 8/14, son would like the MRI today. Son also advised to call ortho and update. Son Medford would like a call back, he number is [phone].  Protocols used: Back Pain-A-AH

## 2023-11-17 NOTE — Telephone Encounter (Signed)
 Message routed to PCP Charlanne Fredia CROME, MD

## 2023-11-17 NOTE — Telephone Encounter (Signed)
 Talked to Patient he has Appointment with [facility] orthopedics tomorrow.And getting MRI tonight.

## 2023-12-03 ENCOUNTER — Ambulatory Visit: Admitting: Nurse Practitioner

## 2023-12-03 ENCOUNTER — Encounter: Payer: Self-pay | Admitting: Nurse Practitioner

## 2023-12-03 VITALS — BP 132/76 | HR 82 | Temp 98.6°F | Ht 72.0 in | Wt 189.0 lb

## 2023-12-03 DIAGNOSIS — R600 Localized edema: Secondary | ICD-10-CM | POA: Diagnosis not present

## 2023-12-03 DIAGNOSIS — M1991 Primary osteoarthritis, unspecified site: Secondary | ICD-10-CM

## 2023-12-03 DIAGNOSIS — K580 Irritable bowel syndrome with diarrhea: Secondary | ICD-10-CM | POA: Diagnosis not present

## 2023-12-03 DIAGNOSIS — C679 Malignant neoplasm of bladder, unspecified: Secondary | ICD-10-CM

## 2023-12-03 DIAGNOSIS — H65193 Other acute nonsuppurative otitis media, bilateral: Secondary | ICD-10-CM

## 2023-12-03 MED ORDER — LORATADINE 10 MG PO TABS: 10.0000 mg | ORAL_TABLET | Freq: Every day | ORAL | 1 refills | Status: DC

## 2023-12-03 NOTE — Assessment & Plan Note (Signed)
 IBS frequent loose stools

## 2023-12-03 NOTE — Assessment & Plan Note (Signed)
 chronic, negative venous US  in the past, Bun/creat 19/0.9 06/29/23

## 2023-12-03 NOTE — Assessment & Plan Note (Addendum)
 feeling stuffy in ear/head, denied HA, dizziness, running nose, change of vision, cough, chest pain/SOB, palpitation, denied sore throat, fever, chills, no O2 desaturation. Ear drum effusion noted  No pain when tragus pressed or auricles manipulated.  CBC/diff, CMP/eGFR next week at [facility]-the patient desires to wait his scheduled labs 12/28/23

## 2023-12-03 NOTE — Patient Instructions (Signed)
 F/u with Dr. Charlanne 12/30/23

## 2023-12-03 NOTE — Assessment & Plan Note (Signed)
 Urinary incontinence, s/p bladder cancer tx followed by oncology

## 2023-12-03 NOTE — Assessment & Plan Note (Signed)
 R shoulder pain, s/p inj

## 2023-12-03 NOTE — Progress Notes (Unsigned)
 Location:   Clinic [facility]   Place of Service:    Provider: [facility] Halo Laski NP  Charlanne Fredia CROME, MD  Patient Care Team: Charlanne Fredia CROME, MD as PCP - General (Internal Medicine)  Extended Emergency Contact Information Primary Emergency Contact: Umanzor,[name] Home Phone: [phone] Mobile Phone: [phone] Relation: Son Secondary Emergency Contact: [name],[name]  [place] Work Phone: [phone] Mobile Phone: [phone] Relation: Daughter  Code Status: DNR Goals of care: Advanced Directive information    11/03/2023    2:21 PM  Advanced Directives  Does Patient Have a Medical Advance Directive? Yes  Type of Estate agent of [place];Living will  Does patient want to make changes to medical advance directive? No - Patient declined  Copy of Healthcare Power of Attorney in Chart? Yes - validated most recent copy scanned in chart (See row information)     Chief Complaint  Patient presents with   Tube Blockage    Possible eustation tube blockage     HPI:  Pt is a 89 y.o. male seen today for an acute visit for feeling stuffy in ear/head, denied HA, dizziness, running nose, change of vision, cough, chest pain/SOB, palpitation, denied sore throat, fever, chills, no O2 desaturation.    Depression/anxiety, on Lexapro, prn Alprazolam . TSH 1.52 06/29/23  Urinary incontinence, s/p bladder cancer tx followed by oncology  OA R shoulder pain, s/p inj  IBS frequent loose stools  Edema BLE, chronic, negative venous US  in the past, Bun/creat 19/0.9 06/29/23 Past Medical History:  Diagnosis Date   Anxiety    Arthritis    Cancer (HCC)    Depression    Pneumonia    Prostate cancer [facility])    Past Surgical History:  Procedure Laterality Date   APPENDECTOMY  1951   CHOLECYSTECTOMY     2006   HERNIA REPAIR  1951   INSERTION PROSTATE RADIATION SEED  2005   MENISCUS REPAIR  1995   TONSILECTOMY, ADENOIDECTOMY, BILATERAL MYRINGOTOMY AND TUBES   1957   trans-urethral resection of prostate  1999, 2003   TRANSURETHRAL RESECTION OF BLADDER TUMOR WITH MITOMYCIN -C N/A 01/08/2021   Procedure: TRANSURETHRAL RESECTION OF BLADDER TUMOR WITH GEMCITABINE ;  Surgeon: Watt Rush, MD;  Location: [facility];  Service: Urology;  Laterality: N/A;   WRIST SURGERY  April and July 2021    Allergies  Allergen Reactions   Prednisone Anxiety    Allergies as of 12/03/2023       Reactions   Prednisone Anxiety        Medication List        Accurate as of December 03, 2023 11:59 PM. If you have any questions, ask your nurse or doctor.          STOP taking these medications    cyclobenzaprine  10 MG tablet Commonly known as: FLEXERIL  Stopped by: Chrystle Murillo X Cher Egnor   escitalopram 20 MG tablet Commonly known as: LEXAPRO Stopped by: Marice Guidone X Levelle Edelen   lidocaine  5 % Commonly known as: Lidoderm  Stopped by: Aaban Griep X Ashonti Leandro   Xanax  0.25 MG tablet Generic drug: ALPRAZolam  Stopped by: Jumaane Weatherford X Taylan Mayhan       TAKE these medications    loratadine  10 MG tablet Commonly known as: Claritin  Take 1 tablet (10 mg total) by mouth daily. Started by: Luciano Cinquemani X Sahil Milner   Vitamin D  50 MCG (2000 UT) tablet Take 1 tablet (2,000 Units total) by mouth daily.        Review of Systems  Constitutional:  Negative for appetite  change, fatigue and fever.  HENT:  Positive for congestion and hearing loss. Negative for ear discharge, ear pain, facial swelling, postnasal drip, rhinorrhea, sinus pressure, sinus pain, sore throat, trouble swallowing and voice change.        Feeling stuffy in ears/head  Eyes:  Negative for visual disturbance.  Respiratory:  Negative for cough and shortness of breath.   Cardiovascular:  Positive for leg swelling. Negative for chest pain and palpitations.  Gastrointestinal:  Negative for abdominal pain.  Genitourinary:  Positive for frequency. Negative for difficulty urinating and dysuria.  Musculoskeletal:  Positive for arthralgias and gait problem.   Neurological:  Negative for speech difficulty and headaches.  Psychiatric/Behavioral:  Negative for confusion and sleep disturbance. The patient is not nervous/anxious.     Immunization History  Administered Date(s) Administered   Fluad Quad(high Dose 65+) 02/01/2021, 01/22/2022   INFLUENZA, HIGH DOSE SEASONAL PF 01/16/2016, 01/21/2017, 01/28/2018   Influenza-Unspecified 01/28/2018, 01/19/2019   Moderna Covid-19 Fall Seasonal Vaccine 28yrs & older 02/11/2022   Moderna Covid-19 Vaccine Bivalent Booster 17yrs & up 01/22/2021, 09/18/2021   Moderna SARS-COV2 Booster Vaccination 02/20/2020, 09/12/2020   Moderna Sars-Covid-2 Vaccination 04/11/2019, 05/09/2019   Pneumococcal Conjugate-13 05/14/2016   Pneumococcal Polysaccharide-23 05/19/2012   Tdap 08/06/2005, 01/27/2019, 08/09/2019   Unspecified SARS-COV-2 Vaccination 02/05/2021   Zoster Recombinant(Shingrix) 09/10/2017, 12/19/2017   Zoster, Live 08/27/2005   Pertinent  Health Maintenance Due  Topic Date Due   INFLUENZA VACCINE  11/06/2023      03/05/2022    8:55 AM 06/25/2022   10:51 AM 12/31/2022   10:52 AM 07/01/2023    1:36 PM 11/03/2023    2:20 PM  Fall Risk  Falls in the past year? 0 1 0 1 1  Was there an injury with Fall? 0 0 0 1 1  Fall Risk Category Calculator 0 1 0 3 2  Fall Risk Category (Retired) Low       (RETIRED) Patient Fall Risk Level Low fall risk       Patient at Risk for Falls Due to No Fall Risks History of fall(s) No Fall Risks No Fall Risks No Fall Risks  Fall risk Follow up Falls evaluation completed  Falls evaluation completed Falls evaluation completed Falls evaluation completed Falls evaluation completed     Data saved with a previous flowsheet row definition   Functional Status Survey:    Vitals:   12/03/23 1506  BP: 132/76  Pulse: 82  Temp: 98.6 F (37 C)  TempSrc: Temporal  SpO2: 98%  Weight: 189 lb (85.7 kg)  Height: 6' (1.829 m)   Body mass index is 25.63 kg/m. Physical Exam Vitals  and nursing note reviewed.  Constitutional:      Appearance: Normal appearance.  HENT:     Head: Normocephalic and atraumatic.     Right Ear: Ear canal and external ear normal.     Left Ear: Ear canal and external ear normal.     Ears:     Comments: Effusion ear drum    Nose: Nose normal.     Mouth/Throat:     Mouth: Mucous membranes are moist.  Eyes:     Extraocular Movements: Extraocular movements intact.     Conjunctiva/sclera: Conjunctivae normal.     Pupils: Pupils are equal, round, and reactive to light.  Cardiovascular:     Rate and Rhythm: Normal rate and regular rhythm.     Heart sounds: No murmur heard. Pulmonary:     Effort: Pulmonary effort is normal.  Breath sounds: No wheezing, rhonchi or rales.  Abdominal:     General: Bowel sounds are normal.     Palpations: Abdomen is soft.     Tenderness: There is no abdominal tenderness.  Musculoskeletal:        General: No tenderness. Normal range of motion.     Cervical back: Normal range of motion and neck supple.     Right lower leg: No edema.     Left lower leg: No edema.  Skin:    General: Skin is warm and dry.     Findings: No rash.  Neurological:     General: No focal deficit present.     Mental Status: He is alert and oriented to person, place, and time. Mental status is at baseline.     Motor: No weakness.     Coordination: Coordination normal.     Gait: Gait abnormal.  Psychiatric:        Mood and Affect: Mood normal.        Behavior: Behavior normal.        Thought Content: Thought content normal.        Judgment: Judgment normal.     Labs reviewed: Recent Labs    06/29/23 0754  NA 135  K 4.0  CL 105  CO2 22  GLUCOSE 137*  BUN 19  CREATININE 0.90  CALCIUM 8.8   Recent Labs    06/29/23 0754  AST 17  ALT 12  BILITOT 1.0  PROT 6.7   Recent Labs    06/29/23 0754  WBC 5.8  NEUTROABS 3,109  HGB 14.1  HCT 42.1  MCV 102.7*  PLT 232   Lab Results  Component Value Date   TSH 1.52  06/29/2023   No results found for: HGBA1C Lab Results  Component Value Date   CHOL 167 06/29/2023   HDL 52 06/29/2023   LDLCALC 97 06/29/2023   TRIG 88 06/29/2023   CHOLHDL 3.2 06/29/2023    Significant Diagnostic Results in last 30 days:  No results found.   Assessment/Plan: Bladder cancer (HCC) Urinary incontinence, s/p bladder cancer tx followed by oncology  Osteoarthritis R shoulder pain, s/p inj  IBS (irritable bowel syndrome) IBS frequent loose stools  Bilateral leg edema  chronic, negative venous US  in the past, Bun/creat 19/0.9 06/29/23  Acute MEE (middle ear effusion), bilateral feeling stuffy in ear/head, denied HA, dizziness, running nose, change of vision, cough, chest pain/SOB, palpitation, denied sore throat, fever, chills, no O2 desaturation. Ear drum effusion noted  No pain when tragus pressed or auricles manipulated.  CBC/diff, CMP/eGFR next week at [facility]-the patient desires to wait his scheduled labs 12/28/23     Family/ staff Communication: plan of care reviewed with the patient  Labs/tests ordered:  CBC/diff, CMP/eGFR,

## 2023-12-04 ENCOUNTER — Encounter: Payer: Self-pay | Admitting: Nurse Practitioner

## 2023-12-15 ENCOUNTER — Ambulatory Visit (INDEPENDENT_AMBULATORY_CARE_PROVIDER_SITE_OTHER): Admitting: Podiatry

## 2023-12-15 ENCOUNTER — Ambulatory Visit (INDEPENDENT_AMBULATORY_CARE_PROVIDER_SITE_OTHER)

## 2023-12-15 VITALS — Ht 72.0 in | Wt 189.0 lb

## 2023-12-15 DIAGNOSIS — M7751 Other enthesopathy of right foot: Secondary | ICD-10-CM | POA: Diagnosis not present

## 2023-12-15 DIAGNOSIS — M7741 Metatarsalgia, right foot: Secondary | ICD-10-CM | POA: Diagnosis not present

## 2023-12-15 DIAGNOSIS — L909 Atrophic disorder of skin, unspecified: Secondary | ICD-10-CM

## 2023-12-15 NOTE — Patient Instructions (Signed)
 The pad I gave you is called a Dancer's pad

## 2023-12-18 NOTE — Progress Notes (Signed)
  Subjective:  Patient ID: Lee Oconnell, male    DOB: 02-05-1934,  MRN: [id number]  Chief Complaint  Patient presents with   Foot Pain    RM 1 Pt is here for right foot pain. Pain present x two years. Pain described as stabing pain when walking. Pain located on ball of the foot ( there is callus present).    89 y.o. male presents with the above complaint. History confirmed with patient.   Objective:  Physical Exam: warm, good capillary refill, no trophic changes or ulcerative lesions, normal DP and PT pulses, normal sensory exam, and small diffuse callus submetatarsal 2/3 right foot.fat pad atrophy metatarsal area   Radiographs: Multiple views x-ray of the right foot: some elongation of the second and third metatarsals Assessment:   1. Capsulitis of metatarsophalangeal (MTP) joint of right foot   2. Fat pad atrophy of foot   3. Metatarsalgia of right foot      Plan:  Patient was evaluated and treated and all questions answered.  Discussed fat pad atrophy metatarsalgia and appropriate offloading.  Dancer's pad applied.  Discussed custom orthotics as well and he will return when necessary if he is interested in new orthotics or refurbishing old ones.  Likely would not be a good surgical candidate for reconstruction.  Return if symptoms worsen or fail to improve.

## 2023-12-21 ENCOUNTER — Telehealth: Payer: Self-pay

## 2023-12-21 DIAGNOSIS — H65193 Other acute nonsuppurative otitis media, bilateral: Secondary | ICD-10-CM

## 2023-12-21 DIAGNOSIS — R600 Localized edema: Secondary | ICD-10-CM

## 2023-12-21 NOTE — Telephone Encounter (Signed)
-----   Message from Man X Mast sent at 12/21/2023  3:34 PM EDT ----- Regarding: RE: Can you please help me order CBC/diff, CMP/egfr for him? Thanks ----- Message ----- From: Suellen Devin BROCKS, CMA Sent: 12/21/2023  12:58 PM EDT To: Man X Mast, NP  ManX,  This patient was told to get labs on 12/28/23, however no orders were placed.  Thanks,  S.Chrae B/CMA

## 2023-12-21 NOTE — Telephone Encounter (Signed)
 Orders are pending for provider to associate appropriate diagnosis and sign

## 2023-12-28 ENCOUNTER — Other Ambulatory Visit

## 2023-12-29 LAB — COMPLETE METABOLIC PANEL WITHOUT GFR
AG Ratio: 1.5 (calc) (ref 1.0–2.5)
ALT: 18 U/L (ref 9–46)
AST: 21 U/L (ref 10–35)
Albumin: 4 g/dL (ref 3.6–5.1)
Alkaline phosphatase (APISO): 62 U/L (ref 35–144)
BUN: 17 mg/dL (ref 7–25)
CO2: 22 mmol/L (ref 20–32)
Calcium: 8.9 mg/dL (ref 8.6–10.3)
Chloride: 109 mmol/L (ref 98–110)
Creat: 0.89 mg/dL (ref 0.70–1.22)
Globulin: 2.6 g/dL (ref 1.9–3.7)
Glucose, Bld: 88 mg/dL (ref 65–99)
Potassium: 4 mmol/L (ref 3.5–5.3)
Sodium: 141 mmol/L (ref 135–146)
Total Bilirubin: 0.9 mg/dL (ref 0.2–1.2)
Total Protein: 6.6 g/dL (ref 6.1–8.1)

## 2023-12-29 LAB — CBC WITH DIFFERENTIAL/PLATELET
Absolute Lymphocytes: 2210 {cells}/uL (ref 850–3900)
Absolute Monocytes: 714 {cells}/uL (ref 200–950)
Basophils Absolute: 61 {cells}/uL (ref 0–200)
Basophils Relative: 0.9 %
Eosinophils Absolute: 231 {cells}/uL (ref 15–500)
Eosinophils Relative: 3.4 %
HCT: 43.8 % (ref 38.5–50.0)
Hemoglobin: 14.6 g/dL (ref 13.2–17.1)
MCH: 33.9 pg — ABNORMAL HIGH (ref 27.0–33.0)
MCHC: 33.3 g/dL (ref 32.0–36.0)
MCV: 101.6 fL — ABNORMAL HIGH (ref 80.0–100.0)
MPV: 10.1 fL (ref 7.5–12.5)
Monocytes Relative: 10.5 %
Neutro Abs: 3584 {cells}/uL (ref 1500–7800)
Neutrophils Relative %: 52.7 %
Platelets: 232 Thousand/uL (ref 140–400)
RBC: 4.31 Million/uL (ref 4.20–5.80)
RDW: 12.6 % (ref 11.0–15.0)
Total Lymphocyte: 32.5 %
WBC: 6.8 Thousand/uL (ref 3.8–10.8)

## 2023-12-29 LAB — LIPID PANEL
Cholesterol: 197 mg/dL (ref <200)
HDL: 59 mg/dL (ref >=40)
LDL Cholesterol (Calc): 118 mg/dL — ABNORMAL HIGH
Non-HDL Cholesterol (Calc): 138 mg/dL — ABNORMAL HIGH (ref <130)
Total CHOL/HDL Ratio: 3.3 (calc) (ref <5.0)
Triglycerides: 94 mg/dL (ref <150)

## 2023-12-29 LAB — TSH: TSH: 2.32 m[IU]/L (ref 0.40–4.50)

## 2023-12-29 LAB — HEMOGLOBIN A1C
Hgb A1c MFr Bld: 5.3 % (ref <5.7)
Mean Plasma Glucose: 105 mg/dL
eAG (mmol/L): 5.8 mmol/L

## 2023-12-30 ENCOUNTER — Non-Acute Institutional Stay: Admitting: Internal Medicine

## 2023-12-30 ENCOUNTER — Encounter: Payer: Self-pay | Admitting: Internal Medicine

## 2023-12-30 VITALS — BP 124/78 | HR 83 | Temp 97.8°F | Resp 18 | Ht 72.0 in | Wt 189.6 lb

## 2023-12-30 DIAGNOSIS — J301 Allergic rhinitis due to pollen: Secondary | ICD-10-CM

## 2023-12-30 DIAGNOSIS — N3945 Continuous leakage: Secondary | ICD-10-CM | POA: Diagnosis not present

## 2023-12-30 DIAGNOSIS — G8929 Other chronic pain: Secondary | ICD-10-CM

## 2023-12-30 DIAGNOSIS — E782 Mixed hyperlipidemia: Secondary | ICD-10-CM | POA: Diagnosis not present

## 2023-12-30 DIAGNOSIS — M545 Low back pain, unspecified: Secondary | ICD-10-CM

## 2023-12-30 DIAGNOSIS — M1991 Primary osteoarthritis, unspecified site: Secondary | ICD-10-CM

## 2023-12-30 NOTE — Patient Instructions (Signed)
 1) Our records indicate you are due for an annual wellness visit, stop at check out to schedule (video or in-person).    2) Please visit your local pharmacy to receive your  flu and Covid vaccine, if you have not already received.

## 2023-12-30 NOTE — Progress Notes (Unsigned)
 Location:  [facility] Service:  Clinic (12)  Provider:   Code Status: *** Goals of Care:     11/03/2023    2:21 PM  Advanced Directives  Does Patient Have a Medical Advance Directive? Yes  Type of Estate agent of [place];Living will  Does patient want to make changes to medical advance directive? No - Patient declined  Copy of Healthcare Power of Attorney in Chart? Yes - validated most recent copy scanned in chart (See row information)     Chief Complaint  Patient presents with   Medical Management of Chronic Issues    Six Month Follow -up with lab    HPI: Patient is a 89 y.o. male seen today for medical management of chronic diseases.    Lives [place] in [facility]  Anxiety with depression Sees Therapist No Meds anymore   H/o Urinary incontinence after undergoing Bladder cancer Treatment   Right Shoulder pain Better after injection from Ortho   IBS with Loose stools   Bilateral Leg swelling Mild Dopplers negative  Discussed the use of AI scribe software for clinical note transcription with the patient, who gave verbal consent to proceed.  History of Present Illness   Lee Oconnell is an 89 year old male Came for Follow up t.  He experiences significant back pain and discomfort,few weeks ago  worsening over the past two to three months. The pain is sharp and was alleviated by about 75% following a steroid injection, though discomfort persists, particularly when getting into bed. The MRI showed spinal stenosis and arthritis throughout his spine.  He has difficulty turning his head without associated pain. He engages in physical therapy and exercises, including using machines that 'push and pull' him, and walks daily for exercise. Despite these efforts, he feels his condition is worsening.  He experiences ongoing bladder incontinence, which is slightly worsening but manageable.  Regarding his right foot, he experienced severe pain and  difficulty walking due to arthritis and loss of padding in the ball of the foot. An X-ray confirmed these issues. Dancer pads have provided some relief by redistributing pressure away from the painful area.  He experiences mild numbness in the tips of his fingers on both hands, which is inconsistent and not bothersome enough to impact daily activities. He has a dry cough and runny nose, possibly due to allergies. He has a history of inner ear problems, including a buzzing sensation, previously treated with medication.  His social history includes regular walks [place] for exercise and social interaction. He is actively engaged in reading, writing, and illustrating as hobbies. He is compiling a family diary with memories of his late wife.      Past Medical History:  Diagnosis Date   Anxiety    Arthritis    Cancer (HCC)    Depression    Pneumonia    Prostate cancer [facility])     Past Surgical History:  Procedure Laterality Date   APPENDECTOMY  1951   CHOLECYSTECTOMY     2006   HERNIA REPAIR  1951   INSERTION PROSTATE RADIATION SEED  2005   MENISCUS REPAIR  1995   TONSILECTOMY, ADENOIDECTOMY, BILATERAL MYRINGOTOMY AND TUBES  1957   trans-urethral resection of prostate  1999, 2003   TRANSURETHRAL RESECTION OF BLADDER TUMOR WITH MITOMYCIN -C N/A 01/08/2021   Procedure: TRANSURETHRAL RESECTION OF BLADDER TUMOR WITH GEMCITABINE ;  Surgeon: Watt Rush, MD;  Location: [facility];  Service: Urology;  Laterality: N/A;   WRIST SURGERY  April and July 2021    Allergies  Allergen Reactions   Prednisone Anxiety    Outpatient Encounter Medications as of 12/30/2023  Medication Sig   Cholecalciferol (VITAMIN D ) 50 MCG (2000 UT) tablet Take 1 tablet (2,000 Units total) by mouth daily.   [DISCONTINUED] loratadine  (CLARITIN ) 10 MG tablet Take 1 tablet (10 mg total) by mouth daily. (Patient not taking: Reported on 12/30/2023)   Facility-Administered Encounter Medications as of 12/30/2023  Medication    gemcitabine  (GEMZAR ) chemo syringe for bladder instillation 2,000 mg    Review of Systems:  Review of Systems  Constitutional:  Negative for activity change, appetite change and unexpected weight change.  HENT: Negative.    Respiratory:  Negative for cough and shortness of breath.   Cardiovascular:  Negative for leg swelling.  Gastrointestinal:  Negative for constipation.  Genitourinary:  Negative for frequency.  Musculoskeletal:  Positive for back pain. Negative for arthralgias, gait problem and myalgias.  Skin: Negative.  Negative for rash.  Neurological:  Negative for dizziness and weakness.  Psychiatric/Behavioral:  Negative for confusion and sleep disturbance.   All other systems reviewed and are negative.   Health Maintenance  Topic Date Due   Medicare Annual Wellness (AWV)  09/17/2022   Influenza Vaccine  11/06/2023   COVID-19 Vaccine (6 - 2025-26 season) 12/07/2023   DTaP/Tdap/Td (4 - Td or Tdap) 08/08/2029   Pneumococcal Vaccine: 50+ Years  Completed   Zoster Vaccines- Shingrix  Completed   HPV VACCINES  Aged Out   Meningococcal B Vaccine  Aged Out    Physical Exam: Vitals:   12/30/23 1322  BP: 124/78  Pulse: 83  Resp: 18  Temp: 97.8 F (36.6 C)  SpO2: 99%  Weight: 189 lb 9.6 oz (86 kg)  Height: 6' (1.829 m)   Body mass index is 25.71 kg/m. Physical Exam Vitals reviewed.  Constitutional:      Appearance: Normal appearance.  HENT:     Head: Normocephalic.     Nose: Nose normal.     Mouth/Throat:     Mouth: Mucous membranes are moist.     Pharynx: Oropharynx is clear. Posterior oropharyngeal erythema present.  Eyes:     Pupils: Pupils are equal, round, and reactive to light.  Cardiovascular:     Rate and Rhythm: Normal rate and regular rhythm.     Pulses: Normal pulses.     Heart sounds: No murmur heard. Pulmonary:     Effort: Pulmonary effort is normal. No respiratory distress.     Breath sounds: Normal breath sounds. No rales.  Abdominal:      General: Abdomen is flat. Bowel sounds are normal.     Palpations: Abdomen is soft.  Musculoskeletal:        General: No swelling.     Cervical back: Neck supple.  Skin:    General: Skin is warm.  Neurological:     General: No focal deficit present.     Mental Status: He is alert and oriented to person, place, and time.  Psychiatric:        Mood and Affect: Mood normal.        Thought Content: Thought content normal.     Labs reviewed: Basic Metabolic Panel: Recent Labs    06/29/23 0754 12/28/23 0748  NA 135 141  K 4.0 4.0  CL 105 109  CO2 22 22  GLUCOSE 137* 88  BUN 19 17  CREATININE 0.90 0.89  CALCIUM 8.8  8.9  TSH 1.52 2.32   Liver Function Tests: Recent Labs    06/29/23 0754 12/28/23 0748  AST 17 21  ALT 12 18  BILITOT 1.0 0.9  PROT 6.7 6.6   No results for input(s): LIPASE, AMYLASE in the last 8760 hours. No results for input(s): AMMONIA in the last 8760 hours. CBC: Recent Labs    06/29/23 0754 12/28/23 0748  WBC 5.8 6.8  NEUTROABS 3,109 3,584  HGB 14.1 14.6  HCT 42.1 43.8  MCV 102.7* 101.6*  PLT 232 232   Lipid Panel: Recent Labs    06/29/23 0754 12/28/23 0748  CHOL 167 197  HDL 52 59  LDLCALC 97 118*  TRIG 88 94  CHOLHDL 3.2 3.3   Lab Results  Component Value Date   HGBA1C 5.3 12/28/2023    Procedures since last visit: DG Foot Complete Right Result Date: 12/15/2023 Please see detailed radiograph report in office note.   Assessment/Plan 1. Primary osteoarthritis, unspecified site (Primary) ***  2. Mixed hyperlipidemia ***  3. Chronic left-sided low back pain without sciatica ***  4. Continuous leakage of urine ***  Assessment and Plan    Lumbar spinal stenosis with osteoarthritis Chronic lumbar spinal stenosis with osteoarthritis causing significant discomfort and limited mobility. Recent steroid injection provided 75% pain relief, but discomfort persists. Condition worsening with increased walking difficulty. -  Continue physical therapy and exercises. - Consider repeat steroid injections every 3-4 months if symptoms worsen. - Follow up with Ortho on Friday for further evaluation.  Right foot osteoarthritis with metatarsal fat pad atrophy Right foot osteoarthritis with metatarsal fat pad atrophy causing significant pain and difficulty walking. X-ray confirmed severe arthritic changes and loss of padding. Dancer pads effective in alleviating pain. - Continue using dancer pads to alleviate foot pain.  Allergic rhinitis with nasal congestion and dry cough Allergic rhinitis with nasal congestion and occasional dry cough, likely due to environmental allergies. - Start Flonase nasal spray for two weeks to alleviate nasal congestion and dry cough. - Consider ENT referral if symptoms persist after Flonase treatment.  Eustachian tube dysfunction with ear fullness and buzzing Eustachian tube dysfunction causing ear fullness and buzzing. Previous treatment with Claritin  provided some relief, but symptoms persist. - Use Flonase nasal spray for two weeks to help alleviate ear fullness and buzzing. - Consider ENT referral if symptoms persist after Flonase treatment.  Peripheral neuropathy, mild, bilateral hands Mild peripheral neuropathy in bilateral hands, presenting as intermittent numbness in the fingertips. Symptoms mild and possibly related to cervical spine issues rather than carpal tunnel syndrome.  Hyperlipidemia Recent increase in cholesterol levels despite adherence to a healthy diet and regular exercise. - Monitor cholesterol levels and dietary habits. - Reassess if cholesterol levels continue to rise.  Memory loss, age-related Age-related memory loss with no significant impact on daily functioning. Continues to engage in cognitive activities and social interactions. - Continue engaging in cognitive activities and social interactions.        Labs/tests ordered:  * No order type specified * Next  appt:  Visit date not found

## 2024-01-13 ENCOUNTER — Encounter: Payer: Self-pay | Admitting: Nurse Practitioner

## 2024-01-14 ENCOUNTER — Encounter: Payer: Self-pay | Admitting: Nurse Practitioner

## 2024-01-14 ENCOUNTER — Non-Acute Institutional Stay: Admitting: Nurse Practitioner

## 2024-01-14 VITALS — BP 116/60 | HR 81 | Temp 98.2°F | Ht 72.0 in | Wt 191.0 lb

## 2024-01-14 DIAGNOSIS — Z0000 Encounter for general adult medical examination without abnormal findings: Secondary | ICD-10-CM

## 2024-01-14 NOTE — Progress Notes (Unsigned)
 Subjective:   Lee Oconnell is a 89 y.o. male who presents for Medicare Annual/Subsequent preventive examination clinic [facility]  Visit Complete: In person  Patient Medicare AWV questionnaire was completed by the patient on 01/14/2024; I have confirmed that all information answered by patient is correct and no changes since this date.  Cardiac Risk Factors include: advanced age (>93men, >67 women);dyslipidemia;hypertension     Objective:    Today's Vitals   01/14/24 1131 01/14/24 1544  BP: 116/60   Pulse: 81   Temp: 98.2 F (36.8 C)   SpO2: 99%   Weight: 191 lb (86.6 kg)   Height: 6' (1.829 m)   PainSc: 8  10-Worst pain ever   Body mass index is 25.9 kg/m.     01/14/2024   11:29 AM 01/13/2024   12:03 PM 11/03/2023    2:21 PM 12/31/2022   10:52 AM 06/25/2022   10:51 AM 03/05/2022    8:55 AM 12/25/2021    2:05 PM  Advanced Directives  Does Patient Have a Medical Advance Directive? Yes Yes Yes Yes Yes Yes Yes  Type of Estate agent of [place];Living will Healthcare Power of [place];Living will Healthcare Power of [place];Living will Healthcare Power of [place];Living will;Out of facility DNR (pink MOST or yellow form) Healthcare Power of [place];Living will;Out of facility DNR (pink MOST or yellow form) Healthcare Power of [place];Living will;Out of facility DNR (pink MOST or yellow form) Healthcare Power of [place];Living will  Does patient want to make changes to medical advance directive? No - Patient declined No - Patient declined No - Patient declined No - Patient declined No - Patient declined  No - Patient declined  Copy of Healthcare Power of Attorney in Chart? Yes - validated most recent copy scanned in chart (See row information) Yes - validated most recent copy scanned in chart (See row information) Yes - validated most recent copy scanned in chart (See row information) Yes - validated most recent copy scanned in chart (See row information) Yes -  validated most recent copy scanned in chart (See row information) Yes - validated most recent copy scanned in chart (See row information)     Current Medications (verified) Outpatient Encounter Medications as of 01/14/2024  Medication Sig   Cholecalciferol (VITAMIN D ) 50 MCG (2000 UT) tablet Take 1 tablet (2,000 Units total) by mouth daily.   Facility-Administered Encounter Medications as of 01/14/2024  Medication   gemcitabine  (GEMZAR ) chemo syringe for bladder instillation 2,000 mg    Allergies (verified) Prednisone   History: Past Medical History:  Diagnosis Date   Anxiety    Arthritis    Cancer (HCC)    Depression    Pneumonia    Prostate cancer [facility])    Past Surgical History:  Procedure Laterality Date   APPENDECTOMY  1951   CHOLECYSTECTOMY     2006   HERNIA REPAIR  1951   INSERTION PROSTATE RADIATION SEED  2005   MENISCUS REPAIR  1995   TONSILECTOMY, ADENOIDECTOMY, BILATERAL MYRINGOTOMY AND TUBES  1957   trans-urethral resection of prostate  1999, 2003   TRANSURETHRAL RESECTION OF BLADDER TUMOR WITH MITOMYCIN -C N/A 01/08/2021   Procedure: TRANSURETHRAL RESECTION OF BLADDER TUMOR WITH GEMCITABINE ;  Surgeon: Watt Rush, MD;  Location: [facility];  Service: Urology;  Laterality: N/A;   WRIST SURGERY  April and July 2021   Family History  Problem Relation Age of Onset   Heart disease Mother    Stroke Father    Cancer Paternal Grandfather  leukemia   Social History   Socioeconomic History   Marital status: Married    Spouse name: Not on file   Number of children: Not on file   Years of education: Not on file   Highest education level: Not on file  Occupational History   Not on file  Tobacco Use   Smoking status: Never   Smokeless tobacco: Never  Vaping Use   Vaping status: Never Used  Substance and Sexual Activity   Alcohol use: Yes    Comment: tablesppon of wine daily   Drug use: No   Sexual activity: Not on file  Other Topics Concern   Not on  file  Social History Narrative   Not on file   Social Drivers of Health   Financial Resource Strain: Low Risk  (09/16/2021)   Overall Financial Resource Strain (CARDIA)    Difficulty of Paying Living Expenses: Not hard at all  Food Insecurity: No Food Insecurity (09/16/2021)   Hunger Vital Sign    Worried About Running Out of Food in the Last Year: Never true    Ran Out of Food in the Last Year: Never true  Transportation Needs: No Transportation Needs (09/16/2021)   PRAPARE - Administrator, Civil Service (Medical): No    Lack of Transportation (Non-Medical): No  Physical Activity: Insufficiently Active (09/16/2021)   Exercise Vital Sign    Days of Exercise per Week: 7 days    Minutes of Exercise per Session: 20 min  Stress: No Stress Concern Present (09/16/2021)   [employer] of Occupational Health - Occupational Stress Questionnaire    Feeling of Stress : Only a little  Social Connections: Moderately Isolated (09/16/2021)   Social Connection and Isolation Panel    Frequency of Communication with Friends and Family: More than three times a week    Frequency of Social Gatherings with Friends and Family: Never    Attends Religious Services: Never    Database administrator or Organizations: No    Attends Engineer, structural: Never    Marital Status: Married    Tobacco Counseling Counseling given: Not Answered   Clinical Intake:  Pre-visit preparation completed: Yes  Pain : 0-10 Pain Score: 10-Worst pain ever Pain Type: Chronic pain Pain Location: Back Pain Orientation: Left Pain Descriptors / Indicators: Spasm Pain Onset: More than a month ago Pain Frequency: Intermittent (with movement, situational) Pain Relieving Factors: resting, certain postion helps, under Ortho care Effect of Pain on Daily Activities: less active  Pain Relieving Factors: resting, certain postion helps, under Ortho care  BMI - recorded: 25.9 Nutritional Risks:  None Diabetes: No  How often do you need to have someone help you when you read instructions, pamphlets, or other written materials from your doctor or pharmacy?: 1 - Never What is the last grade level you completed in school?: DML  Interpreter Needed?: No  Information entered by :: Khianna Blazina Lorenda Hark NP   Activities of Daily Living    01/14/2024    3:54 PM  In your present state of health, do you have any difficulty performing the following activities:  Hearing? 1  Comment hearing aids  Vision? 1  Comment glasses  Difficulty concentrating or making decisions? 1  Walking or climbing stairs? 0  Dressing or bathing? 0  Doing errands, shopping? 0  Preparing Food and eating ? N  Using the Toilet? N  In the past six months, have you accidently leaked urine? Y  Do you  have problems with loss of bowel control? N  Managing your Medications? N  Managing your Finances? N  Housekeeping or managing your Housekeeping? N    Patient Care Team: Charlanne Fredia CROME, MD as PCP - General (Internal Medicine) Fleeta Zerita DASEN, MD as Consulting Physician (Ophthalmology)  Indicate any recent Medical Services you may have received from other than [facility] providers in the past year (date may be approximate).     Assessment:   This is a routine wellness examination for Cher.  Hearing/Vision screen Hearing Screening - Comments:: Patient with hearing loss, under the care of audiologist  Vision Screening - Comments:: Last eye exam less than 12 months ago with [facility], Dr.Van   Goals Addressed             This Visit's Progress    Functional Decline Minimized       Evidence-based guidance:  Assess fall risk, including balance and gait impairment, muscle weakness, diminished vision or hearing, environmental hazards, effects of medication and dehydration.  Assess and review gait speed, decreased muscle strength or impaired functional mobility; consider use of validated tool if available.  Prepare  patient for rehabilitation services to develop an individualized activity and exercise program as indicated, such as progressive resistance strengthening, balance and activity tolerance training or home exercise program.  Prevent falls with environmental adjustment; encourage compliance with exercise program, management of incontinence and adequate vitamin D  and calcium intake from food or supplements.  Promote gradual increase in intensity of activity and exercise as tolerated, such as duration, frequency, exercise repetition and sets and intensity.  Provide guidance and interventions aimed at fall prevention.  Review or assess presence of reduced muscle mass or results of dual-energy x-ray absorptiometry.   Notes:        Depression Screen    01/14/2024    3:18 PM 11/03/2023    2:20 PM 07/01/2023    1:36 PM 12/31/2022   10:52 AM 03/05/2022    8:55 AM 09/16/2021    2:28 PM  PHQ 2/9 Scores  PHQ - 2 Score 0 0 0 0 0 0  PHQ- 9 Score  0        Fall Risk    01/14/2024    3:16 PM 11/03/2023    2:20 PM 07/01/2023    1:36 PM 12/31/2022   10:52 AM 06/25/2022   10:51 AM  Fall Risk   Falls in the past year? 1 1 1  0 1  Number falls in past yr: 0 0 1 0 0  Injury with Fall? 1 1 1  0 0  Comment Broke ribs, hit head, fractured finger bone      Risk for fall due to : Impaired balance/gait No Fall Risks No Fall Risks No Fall Risks History of fall(s)  Follow up  Falls evaluation completed Falls evaluation completed Falls evaluation completed Falls evaluation completed    MEDICARE RISK AT HOME:    TIMED UP AND GO:  Was the test performed?  No    Cognitive Function:    09/16/2021    2:29 PM  MMSE - Mini Mental State Exam  Orientation to time 5  Orientation to Place 5  Registration 3  Attention/ Calculation 5  Recall 2  Language- name 2 objects 2  Language- repeat 1  Language- follow 3 step command 3  Language- read & follow direction 1  Write a sentence 1  Copy design 0  Copy  design-comments Completed clock  Total score 28  01/14/2024    3:19 PM  6CIT Screen  What Year? 0 points  What month? 0 points  What time? 0 points  Count back from 20 0 points  Months in reverse 0 points  Repeat phrase 2 points  Total Score 2 points    Immunizations Immunization History  Administered Date(s) Administered   Fluad Quad(high Dose 65+) 02/01/2021, 01/22/2022   INFLUENZA, HIGH DOSE SEASONAL PF 01/16/2016, 01/21/2017, 01/28/2018, 01/13/2024   Influenza-Unspecified 01/28/2018, 01/19/2019   Moderna Covid-19 Fall Seasonal Vaccine 29yrs & older 02/11/2022, 12/31/2023   Moderna Covid-19 Vaccine Bivalent Booster 83yrs & up 01/22/2021, 09/18/2021   Moderna SARS-COV2 Booster Vaccination 02/20/2020, 09/12/2020   Moderna Sars-Covid-2 Vaccination 04/11/2019, 05/09/2019   Pneumococcal Conjugate-13 05/14/2016   Pneumococcal Polysaccharide-23 05/19/2012   Tdap 08/06/2005, 01/27/2019, 08/09/2019   Unspecified SARS-COV-2 Vaccination 02/05/2021   Zoster Recombinant(Shingrix) 09/10/2017, 12/19/2017   Zoster, Live 08/27/2005    TDAP status: Up to date  Flu Vaccine status: Up to date  Pneumococcal vaccine status: Up to date  Covid-19 vaccine status: Information provided on how to obtain vaccines.   Qualifies for Shingles Vaccine? Yes   Zostavax completed Yes   Shingrix Completed?: Yes  Screening Tests Health Maintenance  Topic Date Due   COVID-19 Vaccine (7 - Moderna risk 2025-26 season) 06/29/2024   Medicare Annual Wellness (AWV)  01/13/2025   DTaP/Tdap/Td (4 - Td or Tdap) 08/08/2029   Pneumococcal Vaccine: 50+ Years  Completed   Influenza Vaccine  Completed   Zoster Vaccines- Shingrix  Completed   Meningococcal B Vaccine  Aged Out    Health Maintenance  There are no preventive care reminders to display for this patient.   Colorectal cancer screening: No longer required.   Lung Cancer Screening: (Low Dose CT Chest recommended if Age 69-80 years, 20  pack-year currently smoking OR have quit w/in 15years.) does not qualify.   Lung Cancer Screening Referral: NA  Additional Screening:  Hepatitis C Screening: does not qualify;   Vision Screening: Recommended annual ophthalmology exams for early detection of glaucoma and other disorders of the eye. Is the patient up to date with their annual eye exam?  yes Who is the provider or what is the name of the office in which the patient attends annual eye exams? Dr. Cleatus If pt is not established with a provider, would they like to be referred to a provider to establish care? No .   Dental Screening: Recommended annual dental exams for proper oral hygiene  Diabetic Foot Exam: NA  Community Resource Referral / Chronic Care Management: CRR required this visit?  No   CCM required this visit?  No     Plan:     I have personally reviewed and noted the following in the patient's chart:   Medical and social history Use of alcohol, tobacco or illicit drugs  Current medications and supplements including opioid prescriptions. Patient is not currently taking opioid prescriptions. Functional ability and status Nutritional status Physical activity Advanced directives List of other physicians Hospitalizations, surgeries, and ER visits in previous 12 months Vitals Screenings to include cognitive, depression, and falls Referrals and appointments  In addition, I have reviewed and discussed with patient certain preventive protocols, quality metrics, and best practice recommendations. A written personalized care plan for preventive services as well as general preventive health recommendations were provided to patient.     Alayjah Boehringer X Everhett Bozard, NP   01/14/2024   After Visit Summary: (In Person-Printed) AVS printed and given to the patient

## 2024-01-15 ENCOUNTER — Encounter: Payer: Self-pay | Admitting: Nurse Practitioner

## 2024-02-23 ENCOUNTER — Ambulatory Visit

## 2024-02-23 ENCOUNTER — Ambulatory Visit (INDEPENDENT_AMBULATORY_CARE_PROVIDER_SITE_OTHER)

## 2024-02-23 DIAGNOSIS — M7751 Other enthesopathy of right foot: Secondary | ICD-10-CM

## 2024-02-23 DIAGNOSIS — M7741 Metatarsalgia, right foot: Secondary | ICD-10-CM

## 2024-02-23 DIAGNOSIS — L909 Atrophic disorder of skin, unspecified: Secondary | ICD-10-CM | POA: Diagnosis not present

## 2024-02-25 NOTE — Progress Notes (Signed)
  Subjective:  Patient ID: Lee Oconnell, male    DOB: August 01, 1933,  MRN: [id number]  Chief Complaint  Patient presents with   Foot Pain    rm    89 y.o. male presents with the above complaint. History confirmed with patient. He states he is unsure if he is placing the metatarsal pads in the correct place on his foot.   Objective:  Physical Exam: warm, good capillary refill, no trophic changes or ulcerative lesions, normal DP and PT pulses, normal sensory exam, and small diffuse callus submetatarsal 2/3 right foot. fat pad atrophy metatarsal area   Radiographs 12/15/23: Multiple views x-ray of the right foot: some elongation of the second and third metatarsals Assessment:   1. Capsulitis of metatarsophalangeal (MTP) joint of right foot   2. Fat pad atrophy of foot   3. Metatarsalgia of right foot       Plan:  Patient was evaluated and treated and all questions answered.  Discussed fat pad atrophy metatarsalgia and appropriate offloading.  Metatarsal pads applied into the patient's shoe in correct location. States the area felt better after pads were placed into his shoes in correct location.  Discussed custom orthotics as well and he will return when necessary if he is interested in new orthotics or refurbishing old ones.  Likely would not be a good surgical candidate for reconstruction.  RTC PRN/for orthotic fitting with offloading metatarsal pad

## 2024-03-01 ENCOUNTER — Encounter: Payer: Self-pay | Admitting: Podiatry

## 2024-03-01 ENCOUNTER — Ambulatory Visit (INDEPENDENT_AMBULATORY_CARE_PROVIDER_SITE_OTHER): Admitting: Podiatry

## 2024-03-01 DIAGNOSIS — M216X1 Other acquired deformities of right foot: Secondary | ICD-10-CM

## 2024-03-01 DIAGNOSIS — M216X2 Other acquired deformities of left foot: Secondary | ICD-10-CM | POA: Diagnosis not present

## 2024-03-01 DIAGNOSIS — M7741 Metatarsalgia, right foot: Secondary | ICD-10-CM | POA: Diagnosis not present

## 2024-03-01 DIAGNOSIS — L909 Atrophic disorder of skin, unspecified: Secondary | ICD-10-CM | POA: Diagnosis not present

## 2024-03-01 NOTE — Progress Notes (Signed)
  Subjective:  Patient ID: Lee Oconnell, male    DOB: 09/12/1933,  MRN: [id number]  Chief Complaint  Patient presents with   Toe Pain    Rm2 Patient complains of right foot in mets 1-5/ padding and inserts with no improvement.    89 y.o. male presents with the above complaint. History confirmed with patient.  He had the pad adjusted, still has not been painful for him  Objective:  Physical Exam: warm, good capillary refill, no trophic changes or ulcerative lesions, normal DP and PT pulses, normal sensory exam, and continued pain in the 2nd and 3rd metatarsal area with fat pad atrophy   Radiographs: Multiple views x-ray of the right foot: some elongation of the second and third metatarsals Assessment:   1. Pronation of left foot   2. Pronation of right foot   3. Metatarsalgia of right foot   4. Fat pad atrophy of foot      Plan:  Patient was evaluated and treated and all questions answered.  We again discussed custom molded foot orthoses for offloading of his metatarsalgia.  We discussed this versus surgical intervention.  We discussed that surgical invention may carry risk in fairly significant recovery for him.  He would like to proceed with custom molded foot orthoses.  All questions addressed.  He was scanned for the orthoses with modifications that will be added to unload the 2nd and 3rd metatarsals with an offload with an EVA cork shell and metatarsal bar.  He will be notified when they are ready for pickup. No follow-ups on file.

## 2024-03-25 ENCOUNTER — Ambulatory Visit

## 2024-03-25 ENCOUNTER — Telehealth: Payer: Self-pay

## 2024-03-25 NOTE — Telephone Encounter (Signed)
Patients orthotics are in .  

## 2024-04-06 NOTE — Telephone Encounter (Signed)
 04/06/24 LVM letting pt know orthotics are in and to call back to schedule an appt

## 2024-04-13 ENCOUNTER — Non-Acute Institutional Stay: Admitting: Internal Medicine

## 2024-04-13 ENCOUNTER — Encounter: Payer: Self-pay | Admitting: Internal Medicine

## 2024-04-13 VITALS — BP 108/76 | HR 78 | Resp 20 | Ht 72.0 in | Wt 186.1 lb

## 2024-04-13 DIAGNOSIS — R6889 Other general symptoms and signs: Secondary | ICD-10-CM

## 2024-04-13 DIAGNOSIS — F339 Major depressive disorder, recurrent, unspecified: Secondary | ICD-10-CM

## 2024-04-13 DIAGNOSIS — Z1152 Encounter for screening for COVID-19: Secondary | ICD-10-CM

## 2024-04-13 DIAGNOSIS — R0981 Nasal congestion: Secondary | ICD-10-CM | POA: Diagnosis not present

## 2024-04-13 DIAGNOSIS — G4700 Insomnia, unspecified: Secondary | ICD-10-CM | POA: Diagnosis not present

## 2024-04-13 DIAGNOSIS — G9331 Postviral fatigue syndrome: Secondary | ICD-10-CM

## 2024-04-13 LAB — POCT INFLUENZA A/B
Influenza A, POC: NEGATIVE
Influenza B, POC: NEGATIVE

## 2024-04-13 LAB — POC COVID19 BINAXNOW: SARS Coronavirus 2 Ag: NEGATIVE

## 2024-04-13 NOTE — Patient Instructions (Addendum)
 Flonase 2 puffs in both Nostrils for 2 weeks at night Xyzal 5 mg every day Hydrate Your self

## 2024-04-14 MED ORDER — ESCITALOPRAM OXALATE 10 MG PO TABS: 10.0000 mg | ORAL_TABLET | Freq: Every day | ORAL | 3 refills | Status: DC

## 2024-04-14 NOTE — Progress Notes (Signed)
 "  Location: [facility] Service:  Clinic (12)  Provider:   Code Status:  Goals of Care:     01/14/2024   11:29 AM  Advanced Directives  Does Patient Have a Medical Advance Directive? Yes  Type of Estate Agent of [place];Living will  Does patient want to make changes to medical advance directive? No - Patient declined  Copy of Healthcare Power of Attorney in Chart? Yes - validated most recent copy scanned in chart (See row information)     Chief Complaint  Patient presents with   Influenza    flu-like symptoms for a three weeks fever, congestion, low energy and appetite, trouble sleeping.     HPI: Patient is a 89 y.o. male seen today for an acute visit   Lives [place] in [facility]   Discussed the use of AI scribe software for clinical note transcription with the patient, who gave verbal consent to proceed.  History of Present Illness   Lee Oconnell is an 89 year old male who presents with persistent flu-like symptoms and anxiety.  For the past two and a half weeks, since December 20th, he has had congestion, difficulty breathing, and trouble sleeping. Simply Saline, Nyquil, and DayQuil have not helped. He uses a humidifier and sleeps in a recliner to breathe more comfortably. He drinks fluids but has low interest in food.  He has anxiety and depression and stopped Lexapro  on his own. He feels more anxious with his current weakness, fears dying, and misses his wife. He has poor appetite and insomnia. He uses Xanax  0.125 mg at night for sleep, which only helps briefly.  He denies cough, chest pain, and current dizziness. His prior vertigo episodes are not active. His son-in-law, a advice worker, advises him by phone.       Past Medical History:  Diagnosis Date   Anxiety    Arthritis    Cancer (HCC)    Depression    Pneumonia    Prostate cancer [facility])     Past Surgical History:  Procedure Laterality Date   APPENDECTOMY  1951    CHOLECYSTECTOMY     2006   HERNIA REPAIR  1951   INSERTION PROSTATE RADIATION SEED  2005   MENISCUS REPAIR  1995   TONSILECTOMY, ADENOIDECTOMY, BILATERAL MYRINGOTOMY AND TUBES  1957   trans-urethral resection of prostate  1999, 2003   TRANSURETHRAL RESECTION OF BLADDER TUMOR WITH MITOMYCIN -C N/A 01/08/2021   Procedure: TRANSURETHRAL RESECTION OF BLADDER TUMOR WITH GEMCITABINE ;  Surgeon: Watt Rush, MD;  Location[facility];  Service: Urology;  Laterality: N/A;   WRIST SURGERY  April and July 2021    Allergies[1]  Outpatient Encounter Medications as of 04/13/2024  Medication Sig   Cholecalciferol (VITAMIN D ) 50 MCG (2000 UT) tablet Take 1 tablet (2,000 Units total) by mouth daily.   Facility-Administered Encounter Medications as of 04/13/2024  Medication   gemcitabine  (GEMZAR ) chemo syringe for bladder instillation 2,000 mg    Review of Systems:  Review of Systems  Constitutional:  Negative for activity change, appetite change and unexpected weight change.  HENT:  Positive for congestion, rhinorrhea and sinus pressure.   Respiratory:  Negative for cough and shortness of breath.   Cardiovascular:  Negative for leg swelling.  Gastrointestinal:  Negative for constipation.  Genitourinary:  Negative for frequency.  Musculoskeletal:  Negative for arthralgias, gait problem and myalgias.  Skin: Negative.  Negative for rash.  Neurological:  Negative for dizziness and  weakness.  Psychiatric/Behavioral:  Positive for dysphoric mood. Negative for confusion and sleep disturbance.   All other systems reviewed and are negative.   Health Maintenance  Topic Date Due   COVID-19 Vaccine (7 - Moderna risk 2025-26 season) 06/29/2024   Medicare Annual Wellness (AWV)  01/13/2025   DTaP/Tdap/Td (4 - Td or Tdap) 08/08/2029   Pneumococcal Vaccine: 50+ Years  Completed   Influenza Vaccine  Completed   Zoster Vaccines- Shingrix  Completed   Meningococcal B Vaccine  Aged Out    Physical Exam: Vitals:    04/13/24 1429  BP: 108/76  Pulse: 78  Resp: 20  SpO2: 98%  Weight: 186 lb 1.6 oz (84.4 kg)  Height: 6' (1.829 m)   Body mass index is 25.24 kg/m. Physical Exam Vitals reviewed.  Constitutional:      Appearance: Normal appearance.  HENT:     Head: Normocephalic.     Nose: Congestion present.     Mouth/Throat:     Mouth: Mucous membranes are moist.     Pharynx: Oropharynx is clear.  Eyes:     Pupils: Pupils are equal, round, and reactive to light.  Cardiovascular:     Rate and Rhythm: Normal rate and regular rhythm.     Pulses: Normal pulses.     Heart sounds: No murmur heard. Pulmonary:     Effort: Pulmonary effort is normal. No respiratory distress.     Breath sounds: Normal breath sounds. No rales.  Abdominal:     General: Abdomen is flat. Bowel sounds are normal.     Palpations: Abdomen is soft.  Musculoskeletal:        General: No swelling.     Cervical back: Neck supple.  Skin:    General: Skin is warm.  Neurological:     General: No focal deficit present.     Mental Status: He is alert and oriented to person, place, and time.  Psychiatric:        Thought Content: Thought content normal.     Comments: Positive for depression      Labs reviewed: Basic Metabolic Panel: Recent Labs    06/29/23 0754 12/28/23 0748  NA 135 141  K 4.0 4.0  CL 105 109  CO2 22 22  GLUCOSE 137* 88  BUN 19 17  CREATININE 0.90 0.89  CALCIUM 8.8 8.9  TSH 1.52 2.32   Liver Function Tests: Recent Labs    06/29/23 0754 12/28/23 0748  AST 17 21  ALT 12 18  BILITOT 1.0 0.9  PROT 6.7 6.6   No results for input(s): LIPASE, AMYLASE in the last 8760 hours. No results for input(s): AMMONIA in the last 8760 hours. CBC: Recent Labs    06/29/23 0754 12/28/23 0748  WBC 5.8 6.8  NEUTROABS 3,109 3,584  HGB 14.1 14.6  HCT 42.1 43.8  MCV 102.7* 101.6*  PLT 232 232   Lipid Panel: Recent Labs    06/29/23 0754 12/28/23 0748  CHOL 167 197  HDL 52 59  LDLCALC 97 118*   TRIG 88 94  CHOLHDL 3.2 3.3   Lab Results  Component Value Date   HGBA1C 5.3 12/28/2023    Procedures since last visit: No results found.  Assessment/Plan 1. Flu-like symptoms (Primary)  - POC COVID-19 BinaxNow - POCT Influenza A/B  2. Encounter for screening for COVID-19  - POC COVID-19 BinaxNow - POCT Influenza A/B   Assessment and Plan    Post viral -infectious upper respiratory symptoms Persistent congestion and weakness  post-flu-like illness. Negative for flu and COVID. Humidifier provided some relief. - Use Flonase nasal spray for two weeks. - Consider over-the-counter Xyzal or Claritin . - Consider ENT referral if congestion persists after two weeks.  Major depressive disorder, recurrent, with anxiety and insomnia Recurrent depressive symptoms with anxiety and insomnia, exacerbated by recent illness. Previously he was doing well  on Lexapro . He stopped himself Current negative thoughts, lack of appetite, and sleep issues. Xanax  used occasionally. - Continue Xanax  as needed. This was prescribed by his Therapist - Encouraged hydration and nutrition.       Patient called later and said he wants to restart his Lexapro   Labs/tests ordered:  * No order type specified * Next appt:  05/04/2024     [1]  Allergies Allergen Reactions   Prednisone Anxiety   "

## 2024-04-20 ENCOUNTER — Encounter: Payer: Self-pay | Admitting: Internal Medicine

## 2024-04-20 ENCOUNTER — Non-Acute Institutional Stay: Admitting: Internal Medicine

## 2024-04-20 VITALS — BP 122/68 | HR 78 | Temp 98.7°F | Resp 18 | Ht 72.0 in | Wt 182.4 lb

## 2024-04-20 DIAGNOSIS — F339 Major depressive disorder, recurrent, unspecified: Secondary | ICD-10-CM

## 2024-04-20 DIAGNOSIS — E538 Deficiency of other specified B group vitamins: Secondary | ICD-10-CM

## 2024-04-20 DIAGNOSIS — E559 Vitamin D deficiency, unspecified: Secondary | ICD-10-CM | POA: Diagnosis not present

## 2024-04-20 DIAGNOSIS — J310 Chronic rhinitis: Secondary | ICD-10-CM | POA: Diagnosis not present

## 2024-04-20 DIAGNOSIS — M1991 Primary osteoarthritis, unspecified site: Secondary | ICD-10-CM

## 2024-04-20 MED ORDER — ALPRAZOLAM 0.25 MG PO TABS: 0.2500 mg | ORAL_TABLET | Freq: Two times a day (BID) | ORAL | 0 refills | Status: DC | PRN

## 2024-04-20 NOTE — Patient Instructions (Signed)
 Labs will be done on tomorrow April 21, 2024  at [facility] at 7:45 am

## 2024-04-20 NOTE — Progress Notes (Signed)
 ````  Location: [facility] [address] of Service:  Clinic (12)  Provider:   Code Status:  Goals of Care:     01/14/2024   11:29 AM  Advanced Directives  Does Patient Have a Medical Advance Directive? Yes  Type of Estate Agent of [place];Living will  Does patient want to make changes to medical advance directive? No - Patient declined  Copy of Healthcare Power of Attorney in Chart? Yes - validated most recent copy scanned in chart (See row information)     Chief Complaint  Patient presents with   Medical Management of Chronic Issues    Follow-up - not feeling any better want to discuss testing and next steps      HPI: Patient is a 89 y.o. male seen today for an acute visit for Depression and Nasal Congestion  Discussed the use of AI scribe software for clinical note transcription with the patient, who gave verbal consent to proceed.  History of Present Illness   Lee Oconnell is an 89 year old male who presents with worsening psychological symptoms and medication management. Depression Patient has a history of depression and had seen prior psychiatry before.  He was on Lexapro  and Xanax .  He stopped the Lexapro  by himself.  And now he has gotten acutely depressed.  I had restarted his Lexapro  at 10 mg but that made him very sleepy so he has cut it down to 5 mg  anxiety as the main concern. He has been taking Xanax  0.125 mg twice daily for a few days,   nasal congestion Patient has been struggling with nasal congestion for few months.  I had restarted his Flonase which he is taking it to proximal in both nostrils. He has congestion and buzzing in one ear that disrupts sleep and increases anxiety about his breathing at night. Flonase at night gives partial relief but wears off overnight. He asks about evaluation by an ENT specialist.  He feels intermittently out of it and asks if blood tests are needed, as family has raised concern about blood-related  issues. He takes vitamin C, vitamin D , and zinc supplements based on family advice.       Past Medical History:  Diagnosis Date   Anxiety    Arthritis    Cancer (HCC)    Depression    Pneumonia    Prostate cancer [facility])     Past Surgical History:  Procedure Laterality Date   APPENDECTOMY  1951   CHOLECYSTECTOMY     2006   HERNIA REPAIR  1951   INSERTION PROSTATE RADIATION SEED  2005   MENISCUS REPAIR  1995   TONSILECTOMY, ADENOIDECTOMY, BILATERAL MYRINGOTOMY AND TUBES  1957   trans-urethral resection of prostate  1999, 2003   TRANSURETHRAL RESECTION OF BLADDER TUMOR WITH MITOMYCIN -C N/A 01/08/2021   Procedure: TRANSURETHRAL RESECTION OF BLADDER TUMOR WITH GEMCITABINE ;  Surgeon: Watt Rush, MD;  Location: [facility];  Service: Urology;  Laterality: N/A;   WRIST SURGERY  April and July 2021    Allergies[1]  Outpatient Encounter Medications as of 04/20/2024  Medication Sig   Cholecalciferol (VITAMIN D ) 50 MCG (2000 UT) tablet Take 1 tablet (2,000 Units total) by mouth daily.   escitalopram  (LEXAPRO ) 10 MG tablet Take 1 tablet (10 mg total) by mouth daily.   Facility-Administered Encounter Medications as of 04/20/2024  Medication   gemcitabine  (GEMZAR ) chemo syringe for bladder instillation 2,000 mg    Review of Systems:  Review of Systems  Constitutional:  Positive for activity change and appetite change. Negative for unexpected weight change.  HENT:  Positive for congestion, postnasal drip and rhinorrhea.   Respiratory:  Negative for cough and shortness of breath.   Cardiovascular:  Negative for leg swelling.  Gastrointestinal:  Negative for constipation.  Genitourinary:  Negative for frequency.  Musculoskeletal:  Negative for arthralgias, gait problem and myalgias.  Skin: Negative.  Negative for rash.  Neurological:  Negative for dizziness and weakness.  Psychiatric/Behavioral:  Positive for sleep disturbance. Negative for confusion. The patient is nervous/anxious.   All  other systems reviewed and are negative.   Health Maintenance  Topic Date Due   COVID-19 Vaccine (7 - Moderna risk 2025-26 season) 06/29/2024   Medicare Annual Wellness (AWV)  01/13/2025   DTaP/Tdap/Td (4 - Td or Tdap) 08/08/2029   Pneumococcal Vaccine: 50+ Years  Completed   Influenza Vaccine  Completed   Zoster Vaccines- Shingrix  Completed   Meningococcal B Vaccine  Aged Out    Physical Exam: Vitals:   04/20/24 0936  BP: 122/68  Pulse: 78  Resp: 18  Temp: 98.7 F (37.1 C)  SpO2: 97%  Weight: 182 lb 6.4 oz (82.7 kg)  Height: 6' (1.829 m)   Body mass index is 24.74 kg/m. Physical Exam Vitals reviewed.  Constitutional:      Appearance: Normal appearance.  HENT:     Head: Normocephalic.     Nose: Congestion present.     Mouth/Throat:     Mouth: Mucous membranes are moist.  Neurological:     Mental Status: He is alert.  Psychiatric:     Comments: Dysphoria Present     Labs reviewed: Basic Metabolic Panel: Recent Labs    06/29/23 0754 12/28/23 0748  NA 135 141  K 4.0 4.0  CL 105 109  CO2 22 22  GLUCOSE 137* 88  BUN 19 17  CREATININE 0.90 0.89  CALCIUM 8.8 8.9  TSH 1.52 2.32   Liver Function Tests: Recent Labs    06/29/23 0754 12/28/23 0748  AST 17 21  ALT 12 18  BILITOT 1.0 0.9  PROT 6.7 6.6   No results for input(s): LIPASE, AMYLASE in the last 8760 hours. No results for input(s): AMMONIA in the last 8760 hours. CBC: Recent Labs    06/29/23 0754 12/28/23 0748  WBC 5.8 6.8  NEUTROABS 3,109 3,584  HGB 14.1 14.6  HCT 42.1 43.8  MCV 102.7* 101.6*  PLT 232 232   Lipid Panel: Recent Labs    06/29/23 0754 12/28/23 0748  CHOL 167 197  HDL 52 59  LDLCALC 97 118*  TRIG 88 94  CHOLHDL 3.2 3.3   Lab Results  Component Value Date   HGBA1C 5.3 12/28/2023    Procedures since last visit: No results found.  Assessment/Plan 1. Chronic rhinitis (Primary) continue Flonase till then  - Ambulatory referral to ENT  2.  Depression, recurrent Continue taking Xanax  as needed.  I have ordered some more Xanax  for him.  He also is on Lexapro  5 mg and would increase it to 10 mg in 2 weeks.  He already has an appointment with the [facility] and will try to follow-up with them - TSH - COMPLETE METABOLIC PANEL WITHOUT GFR - CBC with Differential/Platelet - Vitamin B12  3. Vitamin B 12 deficiency  - TSH - COMPLETE METABOLIC PANEL WITHOUT GFR - CBC with Differential/Platelet - Vitamin B12  4 Vitamin D  deficiency  - Vitamin D , 25-hydroxy    Labs/tests ordered:  *  No order type specified * Next appt:  06/29/2024     [1]  Allergies Allergen Reactions   Prednisone Anxiety

## 2024-04-21 LAB — CBC WITH DIFFERENTIAL/PLATELET
Absolute Lymphocytes: 1575 {cells}/uL (ref 850–3900)
Absolute Monocytes: 730 {cells}/uL (ref 200–950)
Basophils Absolute: 47 {cells}/uL (ref 0–200)
Basophils Relative: 0.7 %
Eosinophils Absolute: 107 {cells}/uL (ref 15–500)
Eosinophils Relative: 1.6 %
HCT: 41.7 % (ref 39.4–51.1)
Hemoglobin: 13.9 g/dL (ref 13.2–17.1)
MCH: 33.4 pg — ABNORMAL HIGH (ref 27.0–33.0)
MCHC: 33.3 g/dL (ref 31.6–35.4)
MCV: 100.2 fL (ref 81.4–101.7)
MPV: 10.2 fL (ref 7.5–12.5)
Monocytes Relative: 10.9 %
Neutro Abs: 4241 {cells}/uL (ref 1500–7800)
Neutrophils Relative %: 63.3 %
Platelets: 291 Thousand/uL (ref 140–400)
RBC: 4.16 Million/uL — ABNORMAL LOW (ref 4.20–5.80)
RDW: 12.3 % (ref 11.0–15.0)
Total Lymphocyte: 23.5 %
WBC: 6.7 Thousand/uL (ref 3.8–10.8)

## 2024-04-21 LAB — VITAMIN B12: Vitamin B-12: 363 pg/mL (ref 200–1100)

## 2024-04-21 LAB — COMPLETE METABOLIC PANEL WITHOUT GFR
AG Ratio: 1.5 (calc) (ref 1.0–2.5)
ALT: 14 U/L (ref 9–46)
AST: 17 U/L (ref 10–35)
Albumin: 3.9 g/dL (ref 3.6–5.1)
Alkaline phosphatase (APISO): 67 U/L (ref 35–144)
BUN: 16 mg/dL (ref 7–25)
CO2: 23 mmol/L (ref 20–32)
Calcium: 8.7 mg/dL (ref 8.6–10.3)
Chloride: 105 mmol/L (ref 98–110)
Creat: 0.83 mg/dL (ref 0.70–1.22)
Globulin: 2.6 g/dL (ref 1.9–3.7)
Glucose, Bld: 104 mg/dL (ref 65–139)
Potassium: 4.2 mmol/L (ref 3.5–5.3)
Sodium: 135 mmol/L (ref 135–146)
Total Bilirubin: 0.9 mg/dL (ref 0.2–1.2)
Total Protein: 6.5 g/dL (ref 6.1–8.1)

## 2024-04-21 LAB — VITAMIN D 25 HYDROXY (VIT D DEFICIENCY, FRACTURES): Vit D, 25-Hydroxy: 61 ng/mL (ref 30–100)

## 2024-04-21 LAB — TSH: TSH: 1.43 m[IU]/L (ref 0.40–4.50)

## 2024-04-22 ENCOUNTER — Ambulatory Visit: Payer: Self-pay | Admitting: Internal Medicine

## 2024-04-26 ENCOUNTER — Ambulatory Visit: Payer: Self-pay

## 2024-04-26 ENCOUNTER — Ambulatory Visit (HOSPITAL_COMMUNITY): Admission: EM | Admit: 2024-04-26 | Discharge: 2024-04-26 | Disposition: A | Discharge status: Home / Self Care

## 2024-04-26 NOTE — Telephone Encounter (Signed)
 Patient is also seeing about getting transportation to the [facility] in [place] today (04/26/2024)   FYI Only or Action Required?: FYI only for provider: appointment scheduled on 04/27/2024 at 11:20am with Dr Charlanne.  Patient was last seen in primary care on 04/20/2024 by Charlanne Fredia CROME, MD.  Called Nurse Triage reporting Depression.  Symptoms began over a week ago.  Interventions attempted: Prescription medications: Xanax , Lexapro  and Rest, hydration, or home remedies.  Symptoms are: unchanged.  Triage Disposition: See Physician Within 24 Hours  Patient/caregiver understands and will follow disposition?: Yes       Message from Alfonso ORN sent at 04/26/2024  3:02 PM EST  Summary: anxiety and depression   Reason for Triage: [facility] saw provider last week did not mention is mental state pt feel so bad dealing with  anxiety , depression      Reason for Disposition  [1] Depression AND [2] getting worse (e.g., sleeping poorly, less able to do activities of daily living)  Answer Assessment - Initial Assessment Questions Patient called and advised that he was in [place] over the holidays with his son and family He states he had a flu while he was there---ten days He had a fever for three days Patient states he came back the first week of January He states since then he has been experiencing depression and anxiety He states that Dr Charlanne labeled it post viral syndrome He states he hasn't been able to function normally He has been exercising and getting out of his apartment frequently throughout the day He just woke up from a nap and he states he felt a horrible alienation in his head He is taking Lexapro  10mg  a day at night He is also taking 0.25mg  Xanax  and he breaks those in half. He states his head feels like he has a fog in it He feels like his life is a hollow mechanical process & he is doing things but doesn't feel any  meaningful connection to anything He states that he can still function and exercise and go through daily activities  He saw Dr Charlanne last Wednesday and wants to see her again---He is states he is no better  Patient denies any thoughts of harming himself or anyone else  Patient states that [facility] is near where he lives and he ended up taking Lexapro  & he ended up weaning himself off of that months ago and states he was fine but now he is taking it again.  Patient states that he can't watch the news because it makes him upset He states he doesn't have any motivation to read or draw now.  Patient wanted to see his PCP Dr Charlanne again and an appointment was made for 04/27/2024 at 11:20am with Dr Charlanne Patient is given additional resource information: [id number] crisis hotline number, and also the address and phone number of the [facility]gent [facility] in [place].  Patient is advised that this Urgent Care would be beneficial for today with how he is feeling--he states that he has a few options for transportation and he will see about getting someone to take him there for evaluation today as well. He is advised that at any point he can call 911 to go to the Emergency Room Patient is advised to call back with any further questions/concerns and if anything changes. Patient verbalized understanding at this time.  Protocols used: Depression-A-AH

## 2024-04-26 NOTE — Progress Notes (Signed)
 Pt left after triage and before being seen by provider. Pt is discharged at this time.

## 2024-04-27 ENCOUNTER — Non-Acute Institutional Stay: Admitting: Internal Medicine

## 2024-04-27 ENCOUNTER — Encounter: Payer: Self-pay | Admitting: Internal Medicine

## 2024-04-27 VITALS — BP 134/76 | HR 69 | Temp 97.7°F | Resp 18 | Ht 72.0 in | Wt 182.7 lb

## 2024-04-27 DIAGNOSIS — F32A Depression, unspecified: Secondary | ICD-10-CM

## 2024-04-27 DIAGNOSIS — J310 Chronic rhinitis: Secondary | ICD-10-CM

## 2024-04-27 MED ORDER — BUPROPION HCL ER (XL) 150 MG PO TB24: 150.0000 mg | ORAL_TABLET | Freq: Every day | ORAL | 1 refills | Status: DC

## 2024-04-27 NOTE — Progress Notes (Signed)
 "  Location: [facility] of Service:  Clinic (12)  Provider:   Code Status:  Goals of Care:     01/14/2024   11:29 AM  Advanced Directives  Does Patient Have a Medical Advance Directive? Yes  Type of Estate Agent of [place];Living will  Does patient want to make changes to medical advance directive? No - Patient declined  Copy of Healthcare Power of Attorney in Chart? Yes - validated most recent copy scanned in chart (See row information)     Chief Complaint  Patient presents with   Anxiety    Depression & Anxiety---not any better since visit with PCP a week ago/see Triage RN notes from yesterday and patient went to the ED.      HPI: Patient is a 89 y.o. male seen today for an acute visit for Depression and Anxiety  Depression Patient has a history of depression and had seen prior psychiatry before.  He was on Lexapro  and Xanax .  He stopped the Lexapro  by himself.  And now he has gotten acutely depressed.  I had restarted his Lexapro  at 10 mg  He called the office yesterday and was told to go to behavioral health urgent care.  He says that when he went there there were a lot of Patients  and he decided to make appointment with me and could not seem to  Discussed the use of AI scribe software for clinical note transcription with the patient, who gave verbal consent to proceed.  History of Present Illness   Lee Oconnell is an 89 year old male who presents with anxiety and sleep disturbances.  He describes a persistent cloud in front of my head, mental fog, and feeling unable to react normally. He restarted Lexapro  about two weeks ago, taking roughly eight doses. He feels it is not yet therapeutic but notes it was effective in the past. He has an undercurrent of anxiety throughout the day.  He uses Xanax  0.25 mg, typically half a tablet at night for sleep and sometimes another half around 4 AM if he wakes and cannot return to sleep due to  anxiety. He has also taken daytime doses for anxiety with partial benefit. His sleep remains fragmented, and he feels he must force himself through the day despite adequate physical energy and regular exercise.  He wakes at least four times nightly to void due to a bladder problem, which further disrupts sleep and contributes to his difficulty sleeping through the night.  He notes improved nasal symptoms with Flonase. He takes B12 supplements for previously low levels. He continues to feel anxious most mornings and throughout the day with ongoing mental fog and low mental energy.       Past Medical History:  Diagnosis Date   Anxiety    Arthritis    Cancer (HCC)    Depression    Pneumonia    Prostate cancer [facility])     Past Surgical History:  Procedure Laterality Date   APPENDECTOMY  1951   CHOLECYSTECTOMY     2006   HERNIA REPAIR  1951   INSERTION PROSTATE RADIATION SEED  2005   MENISCUS REPAIR  1995   TONSILECTOMY, ADENOIDECTOMY, BILATERAL MYRINGOTOMY AND TUBES  1957   trans-urethral resection of prostate  1999, 2003   TRANSURETHRAL RESECTION OF BLADDER TUMOR WITH MITOMYCIN -C N/A 01/08/2021   Procedure: TRANSURETHRAL RESECTION OF BLADDER TUMOR WITH GEMCITABINE ;  Surgeon: Watt Rush, MD;  Location: [facility];  Service: Urology;  Laterality: N/A;   WRIST SURGERY  April and July 2021    Allergies[1]  Outpatient Encounter Medications as of 04/27/2024  Medication Sig   ALPRAZolam  (XANAX ) 0.25 MG tablet Take 1 tablet (0.25 mg total) by mouth 2 (two) times daily as needed for anxiety.   buPROPion  (WELLBUTRIN  XL) 150 MG 24 hr tablet Take 1 tablet (150 mg total) by mouth daily.   Cholecalciferol (VITAMIN D ) 50 MCG (2000 UT) tablet Take 1 tablet (2,000 Units total) by mouth daily.   escitalopram  (LEXAPRO ) 10 MG tablet Take 1 tablet (10 mg total) by mouth daily.   Facility-Administered Encounter Medications as of 04/27/2024  Medication   gemcitabine  (GEMZAR ) chemo syringe for bladder  instillation 2,000 mg    Review of Systems:  Review of Systems  Constitutional:  Positive for activity change. Negative for appetite change and unexpected weight change.  HENT: Negative.    Respiratory:  Negative for cough and shortness of breath.   Cardiovascular:  Negative for leg swelling.  Gastrointestinal:  Negative for constipation.  Genitourinary:  Negative for frequency.  Musculoskeletal:  Negative for arthralgias, gait problem and myalgias.  Skin: Negative.  Negative for rash.  Neurological:  Negative for dizziness and weakness.  Psychiatric/Behavioral:  Positive for decreased concentration, dysphoric mood and sleep disturbance. Negative for confusion. The patient is nervous/anxious.   All other systems reviewed and are negative.   Health Maintenance  Topic Date Due   COVID-19 Vaccine (7 - Moderna risk 2025-26 season) 06/29/2024   Medicare Annual Wellness (AWV)  01/13/2025   DTaP/Tdap/Td (4 - Td or Tdap) 08/08/2029   Pneumococcal Vaccine: 50+ Years  Completed   Influenza Vaccine  Completed   Zoster Vaccines- Shingrix  Completed   Meningococcal B Vaccine  Aged Out    Physical Exam: Vitals:   04/27/24 1130  BP: 134/76  Pulse: 69  Resp: 18  Temp: 97.7 F (36.5 C)  SpO2: 96%  Weight: 182 lb 11.2 oz (82.9 kg)  Height: 6' (1.829 m)   Body mass index is 24.78 kg/m. Physical Exam Vitals reviewed.  Constitutional:      Appearance: Normal appearance.  HENT:     Head: Normocephalic.     Nose: Nose normal. No congestion.  Neurological:     General: No focal deficit present.     Mental Status: He is alert and oriented to person, place, and time.  Psychiatric:        Attention and Perception: Attention normal.        Mood and Affect: Mood is anxious.        Speech: Speech normal.        Behavior: Behavior normal.        Thought Content: Thought content normal.     Labs reviewed: Basic Metabolic Panel: Recent Labs    06/29/23 0754 12/28/23 0748  04/21/24 0741  NA 135 141 135  K 4.0 4.0 4.2  CL 105 109 105  CO2 22 22 23   GLUCOSE 137* 88 104  BUN 19 17 16   CREATININE 0.90 0.89 0.83  CALCIUM 8.8 8.9 8.7  TSH 1.52 2.32 1.43   Liver Function Tests: Recent Labs    06/29/23 0754 12/28/23 0748 04/21/24 0741  AST 17 21 17   ALT 12 18 14   BILITOT 1.0 0.9 0.9  PROT 6.7 6.6 6.5   No results for input(s): LIPASE, AMYLASE in the last 8760 hours. No results for input(s): AMMONIA in the last 8760 hours. CBC: Recent Labs    06/29/23  9245 12/28/23 0748 04/21/24 0741  WBC 5.8 6.8 6.7  NEUTROABS 3,109 3,584 4,241  HGB 14.1 14.6 13.9  HCT 42.1 43.8 41.7  MCV 102.7* 101.6* 100.2  PLT 232 232 291   Lipid Panel: Recent Labs    06/29/23 0754 12/28/23 0748  CHOL 167 197  HDL 52 59  LDLCALC 97 118*  TRIG 88 94  CHOLHDL 3.2 3.3   Lab Results  Component Value Date   HGBA1C 5.3 12/28/2023    Procedures since last visit: No results found.  Assessment/Plan 1. Acute depression (Primary) Patient continues to have problems with acute depression.  He is doing better though with Lexapro  10 mg is taking it at night We talked about starting him on Remeron  to help sleep at night  but patient said that he has to get up for 5 times at night to go to [facility] and was scared he will have accident I will start him on Wellbutrin  150 mg  in the morning. Lexapro  at night Continue Using Xanax  PRN  He doe shave Appointment with [facility] in 2 weeks  2. Chronic rhinitis His Symptoms are better on Flonase Does not want to see ENT right now   Assessment and Plan    Major depressive disorder with anxiety and insomnia Persistent anxiety and insomnia despite Lexapro  10 mg. Lexapro  previously effective, current dose may be insufficient. Mirtazapine  considered for sleep and mood improvement, non-addictive alternative to Xanax . - Continue Lexapro  10 mg daily with dinner. - Initiated mirtazapine  for sleep and mood. - Advised  mirtazapine  at 9:30 PM for sleep by 10 PM. - Continue Xanax  as needed, aim to reduce reliance with mirtazapine .  Nocturia Wakes at least four times per night, disrupting sleep. Concern about mirtazapine  causing excessive sleepiness and potential accidents.  Vitamin B12 deficiency Vitamin B12 levels slightly low, currently taking supplements. - Continue Vitamin B12 supplementation.       Labs/tests ordered:  * No order type specified * Next appt:  05/04/2024     [1]  Allergies Allergen Reactions   Prednisone Anxiety   "

## 2024-05-04 ENCOUNTER — Encounter: Admitting: Internal Medicine

## 2024-05-04 ENCOUNTER — Non-Acute Institutional Stay: Admitting: Internal Medicine

## 2024-05-04 ENCOUNTER — Encounter: Payer: Self-pay | Admitting: Internal Medicine

## 2024-05-04 VITALS — BP 132/78 | HR 66 | Temp 97.5°F | Resp 18 | Ht 72.0 in | Wt 181.3 lb

## 2024-05-04 DIAGNOSIS — J310 Chronic rhinitis: Secondary | ICD-10-CM | POA: Diagnosis not present

## 2024-05-04 DIAGNOSIS — F32A Depression, unspecified: Secondary | ICD-10-CM

## 2024-05-04 MED ORDER — ALPRAZOLAM 0.25 MG PO TABS: 0.2500 mg | ORAL_TABLET | Freq: Two times a day (BID) | ORAL | 0 refills | Status: AC | PRN

## 2024-05-04 MED ORDER — ESCITALOPRAM OXALATE 10 MG PO TABS: 10.0000 mg | ORAL_TABLET | Freq: Every day | ORAL | 3 refills | Status: AC

## 2024-05-04 MED ORDER — ESCITALOPRAM OXALATE 5 MG PO TABS: 5.0000 mg | ORAL_TABLET | Freq: Every day | ORAL | 3 refills | Status: AC

## 2024-05-04 NOTE — Progress Notes (Signed)
 "  [facility] Service:  Clinic (12)  Provider:   Code Status:  Goals of Care:     01/14/2024   11:29 AM  Advanced Directives  Does Patient Have a Medical Advance Directive? Yes  Type of Estate Agent of [place];Living will  Does patient want to make changes to medical advance directive? No - Patient declined  Copy of Healthcare Power of Attorney in Chart? Yes - validated most recent copy scanned in chart (See row information)     Chief Complaint  Patient presents with   Medical Management of Chronic Issues    Follow-up with Depression and Anxiety     HPI: Patient is a 89 y.o. male seen today for an acute visit for follow-up of depression and anxiety  I had started patient on Wellbutrin  last visit He feels like it made his insomnia worse and stopped after 3 days Presently he is on Lexapro  10 mg He is wondering if that can also give insomnia He continues to get up at night multiple times to go to the bathroom and then it is hard for him to go back to sleep.  He gets very anxious has to take Xanax  to help himself relax and sleep.  He is napping in the afternoon but feels like he is overwhelmed. Depression is slightly better but he still feels like he is disconnected from the world.  Hard for him to get up in the morning and face his Errands  Pervious History Patient has a history of depression and had seen prior psychiatry before. He was on Lexapro  and Xanax . He stopped the Lexapro  by himself. And now he has gotten acutely depressed  Past Medical History:  Diagnosis Date   Anxiety    Arthritis    Cancer (HCC)    Depression    Pneumonia    Prostate cancer [facility])     Past Surgical History:  Procedure Laterality Date   APPENDECTOMY  1951   CHOLECYSTECTOMY     2006   HERNIA REPAIR  1951   INSERTION PROSTATE RADIATION SEED  2005   MENISCUS REPAIR  1995   TONSILECTOMY, ADENOIDECTOMY, BILATERAL MYRINGOTOMY AND TUBES  1957    trans-urethral resection of prostate  1999, 2003   TRANSURETHRAL RESECTION OF BLADDER TUMOR WITH MITOMYCIN -C N/A 01/08/2021   Procedure: TRANSURETHRAL RESECTION OF BLADDER TUMOR WITH GEMCITABINE ;  Surgeon: Watt Rush, MD;  Location: [facility];  Service: Urology;  Laterality: N/A;   WRIST SURGERY  April and July 2021    Allergies[1]  Outpatient Encounter Medications as of 05/04/2024  Medication Sig   ALPRAZolam  (XANAX ) 0.25 MG tablet Take 1 tablet (0.25 mg total) by mouth 2 (two) times daily as needed for anxiety.   Cholecalciferol (VITAMIN D ) 50 MCG (2000 UT) tablet Take 1 tablet (2,000 Units total) by mouth daily.   escitalopram  (LEXAPRO ) 10 MG tablet Take 1 tablet (10 mg total) by mouth daily.   escitalopram  (LEXAPRO ) 5 MG tablet Take 1 tablet (5 mg total) by mouth daily.   [DISCONTINUED] ALPRAZolam  (XANAX ) 0.25 MG tablet Take 1 tablet (0.25 mg total) by mouth 2 (two) times daily as needed for anxiety.   [DISCONTINUED] buPROPion  (WELLBUTRIN  XL) 150 MG 24 hr tablet Take 1 tablet (150 mg total) by mouth daily.   [DISCONTINUED] escitalopram  (LEXAPRO ) 10 MG tablet Take 1 tablet (10 mg total) by mouth daily.   Facility-Administered Encounter Medications as of 05/04/2024  Medication   gemcitabine  (GEMZAR ) chemo  syringe for bladder instillation 2,000 mg    Review of Systems:  Review of Systems  Constitutional:  Negative for activity change, appetite change and unexpected weight change.  HENT: Negative.    Respiratory:  Negative for cough and shortness of breath.   Cardiovascular:  Negative for leg swelling.  Gastrointestinal:  Negative for constipation.  Genitourinary:  Negative for frequency.  Musculoskeletal:  Negative for arthralgias, gait problem and myalgias.  Skin: Negative.  Negative for rash.  Neurological:  Negative for dizziness and weakness.  Psychiatric/Behavioral:  Positive for dysphoric mood and sleep disturbance. Negative for confusion. The patient is nervous/anxious.   All  other systems reviewed and are negative.   Health Maintenance  Topic Date Due   COVID-19 Vaccine (7 - Moderna risk 2025-26 season) 06/29/2024   Medicare Annual Wellness (AWV)  01/13/2025   DTaP/Tdap/Td (4 - Td or Tdap) 08/08/2029   Pneumococcal Vaccine: 50+ Years  Completed   Influenza Vaccine  Completed   Zoster Vaccines- Shingrix  Completed   Meningococcal B Vaccine  Aged Out    Physical Exam: Vitals:   05/04/24 1132  BP: 132/78  Pulse: 66  Resp: 18  Temp: (!) 97.5 F (36.4 C)  SpO2: 97%  Weight: 181 lb 4.8 oz (82.2 kg)  Height: 6' (1.829 m)   Body mass index is 24.59 kg/m. Physical Exam Vitals reviewed.  Constitutional:      Appearance: Normal appearance.  Neurological:     General: No focal deficit present.     Mental Status: He is alert and oriented to person, place, and time.  Psychiatric:     Comments: Mood seems a little better.  Less anxious     Labs reviewed: Basic Metabolic Panel: Recent Labs    06/29/23 0754 12/28/23 0748 04/21/24 0741  NA 135 141 135  K 4.0 4.0 4.2  CL 105 109 105  CO2 22 22 23   GLUCOSE 137* 88 104  BUN 19 17 16   CREATININE 0.90 0.89 0.83  CALCIUM 8.8 8.9 8.7  TSH 1.52 2.32 1.43   Liver Function Tests: Recent Labs    06/29/23 0754 12/28/23 0748 04/21/24 0741  AST 17 21 17   ALT 12 18 14   BILITOT 1.0 0.9 0.9  PROT 6.7 6.6 6.5   No results for input(s): LIPASE, AMYLASE in the last 8760 hours. No results for input(s): AMMONIA in the last 8760 hours. CBC: Recent Labs    06/29/23 0754 12/28/23 0748 04/21/24 0741  WBC 5.8 6.8 6.7  NEUTROABS 3,109 3,584 4,241  HGB 14.1 14.6 13.9  HCT 42.1 43.8 41.7  MCV 102.7* 101.6* 100.2  PLT 232 232 291   Lipid Panel: Recent Labs    06/29/23 0754 12/28/23 0748  CHOL 167 197  HDL 52 59  LDLCALC 97 118*  TRIG 88 94  CHOLHDL 3.2 3.3   Lab Results  Component Value Date   HGBA1C 5.3 12/28/2023    Procedures since last visit: No results  found.  Assessment/Plan 1. Acute depression (Primary) Patient will change his Lexapro  to 15 mg in the morning Eventually goal is to get him to 20 mg Going slow as patient has had side effects with faster changing the dose Discontinue Wellbutrin  Patient has appointment with the psychiatrist that he has seen before Next week He still wants to come and see me after that to discuss the changes  2. Chronic rhinitis Much better with Flonase.  Can continue using it as needed    Labs/tests ordered:  *  No order type specified * Next appt:  05/11/2024     [1]  Allergies Allergen Reactions   Prednisone Anxiety   "

## 2024-05-05 ENCOUNTER — Ambulatory Visit (INDEPENDENT_AMBULATORY_CARE_PROVIDER_SITE_OTHER): Admitting: Podiatrist

## 2024-05-05 ENCOUNTER — Ambulatory Visit: Payer: Self-pay

## 2024-05-05 DIAGNOSIS — M216X2 Other acquired deformities of left foot: Secondary | ICD-10-CM

## 2024-05-05 DIAGNOSIS — M7741 Metatarsalgia, right foot: Secondary | ICD-10-CM

## 2024-05-05 DIAGNOSIS — L909 Atrophic disorder of skin, unspecified: Secondary | ICD-10-CM

## 2024-05-05 DIAGNOSIS — M216X1 Other acquired deformities of right foot: Secondary | ICD-10-CM

## 2024-05-05 DIAGNOSIS — M7751 Other enthesopathy of right foot: Secondary | ICD-10-CM

## 2024-05-05 NOTE — Progress Notes (Signed)
 ORTHOTIC DISPENSING:   Reason for Visit:         Fitting and Delivery of Custom Fabricated Foot Orthoses Patient Report:            Patient reports comfort and is satisfied with device.   OBJECTIVE DATA: Patient History / Diagnosis:    No change in pathology Provided Device:                     Functional foot orthoses   GOAL OF ORTHOSIS - Improve gait - Decrease energy expenditure - Improve Balance - Provide Triplanar stability of foot complex - Facilitate motion   ACTIONS PERFORMED Patient was fit with custom foot orthoses   Patient was provided with verbal and written instruction and demonstration regarding wear, care, proper fit, function, and use of the orthosis.    Patient was also provided with verbal instruction regarding how to report any failures or malfunctions of the orthosis and necessary follow up care. Patient was also instructed to contact our office regarding any change in status that may affect the function of the orthosis.   Patient demonstrated understanding of all instructions.  Lamarr Salen, DPM   The shoes he prefers to wear are sandals by [employer].  I trimmed the orthotics to fit the sandals he is wearing today.  He relates they feel good but he wants to try out the orthotic and will follow up if he needs an adjustment.

## 2024-05-05 NOTE — Telephone Encounter (Signed)
 FYI Only or Action Required?: Action required by provider: update on patient condition. Pt would like to know next steps  Patient was last seen in primary care on 05/04/2024 by Charlanne Fredia CROME, MD.  Called Nurse Triage reporting Depression.  Symptoms began today.  Interventions attempted: Nothing.  Symptoms are: gradually worsening.  Triage Disposition: See PCP When Office is Open (Within 3 Days)  Patient/caregiver understands and will follow disposition?: No, wishes to speak with PCP  Summary: Depression, sluggish, fogginess   Reason for Triage: experiencing, Depression, sluggish, fogginess told to increase the medication:  escitalopram  (LEXAPRO ) And he feels as if it has made it worse         Reason for Disposition  [1] New or changed anti-depressant medication > 2 weeks ago AND [2] not feeling any better  Answer Assessment - Initial Assessment Questions 1. CONCERN: What happened that made you call today?     Brain fog, does not feel as though the additional 15 mg of Lexapro  in the morning is going to be beneficial 2. DEPRESSION SYMPTOM SCREENING: How are you feeling overall? (e.g., decreased energy, increased sleeping or difficulty sleeping, difficulty concentrating, feelings of sadness, guilt, hopelessness, or worthlessness)     Insomnia, brain fog 3. RISK OF HARM - SUICIDAL IDEATION:  Do you ever have thoughts of hurting or killing yourself?  (e.g., yes, no, no but preoccupation with thoughts about death)     denies 4. RISK OF HARM - HOMICIDAL IDEATION:  Do you ever have thoughts of hurting or killing someone else?  (e.g., yes, no, no but preoccupation with thoughts about death)     denies 5. FUNCTIONAL IMPAIRMENT: How have things been going for you overall? Have you had more difficulty than usual doing your normal daily activities?  (e.g., better, same, worse; self-care, school, work, interactions)     Brain fog  Pt states was seen by pcp yesterday, was told to  increase Lexapro , states that he has been having insomnia. Pt states that he was told to take 10 mg of Lexapro  at noc as well as 15 mg of Lexapro  in the morning, pt states that he does not feel as though it helped today. Pt asking what next steps to take per pcp.  Protocols used: Depression-A-AH

## 2024-05-05 NOTE — Telephone Encounter (Signed)
 D/W the patient He is going to try 15 mg Lexapro  again tomorrow. If it makes him feel bad he is going to switch it to Night time on Sat.

## 2024-05-11 ENCOUNTER — Encounter: Payer: Self-pay | Admitting: Internal Medicine

## 2024-05-11 ENCOUNTER — Non-Acute Institutional Stay: Admitting: Internal Medicine

## 2024-05-11 VITALS — BP 128/78 | HR 73 | Temp 97.4°F | Resp 18 | Ht 72.0 in | Wt 183.1 lb

## 2024-05-11 DIAGNOSIS — E538 Deficiency of other specified B group vitamins: Secondary | ICD-10-CM

## 2024-05-11 DIAGNOSIS — E559 Vitamin D deficiency, unspecified: Secondary | ICD-10-CM

## 2024-05-11 DIAGNOSIS — J310 Chronic rhinitis: Secondary | ICD-10-CM

## 2024-05-11 DIAGNOSIS — F32A Depression, unspecified: Secondary | ICD-10-CM

## 2024-05-11 MED ORDER — TRAZODONE HCL 50 MG PO TABS: 25.0000 mg | ORAL_TABLET | Freq: Every evening | ORAL | Status: AC | PRN

## 2024-05-13 NOTE — Progress Notes (Signed)
 "  [facility]  Cl[facility] (12)  Provider:   Code Status:  Goals of Care:     01/14/2024   11:29 AM  Advanced Directives  Does Patient Have a Medical Advance Directive? Yes  Type of Estate Agent of [place];Living will  Does patient want to make changes to medical advance directive? No - Patient declined  Copy of Healthcare Power of Attorney in Chart? Yes - validated most recent copy scanned in chart (See row information)     Chief Complaint  Patient presents with   Medical Management of Chronic Issues    Follow-up with Charlanne Fredia CROME, MD for Anxiety and depression    HPI: Patient is a 89 y.o. male seen today for Acute visit  Lives in [place] in [facility]  Patient has a history of depression and had seen prior psychiatry before. He was on Lexapro  and Xanax . He stopped the Lexapro  by himself. And now he has gotten acutely depressed   Lexapro  restarted Now at 15 mg  Saw his Psychiatrist Added Trazadone 25 mg at night  Feels good today Will Continue to follow with Psych services now Also will get Xanax  Prn from them  Past Medical History:  Diagnosis Date   Anxiety    Arthritis    Cancer (HCC)    Depression    Pneumonia    Prostate cancer [facility])     Past Surgical History:  Procedure Laterality Date   APPENDECTOMY  1951   CHOLECYSTECTOMY     2006   HERNIA REPAIR  1951   INSERTION PROSTATE RADIATION SEED  2005   MENISCUS REPAIR  1995   TONSILECTOMY, ADENOIDECTOMY, BILATERAL MYRINGOTOMY AND TUBES  1957   trans-urethral resection of prostate  1999, 2003   TRANSURETHRAL RESECTION OF BLADDER TUMOR WITH MITOMYCIN -C N/A 01/08/2021   Procedure: TRANSURETHRAL RESECTION OF BLADDER TUMOR WITH GEMCITABINE ;  Surgeon: Watt Rush, MD;  Location: [facility];  Service: Urology;  Laterality: N/A;   WRIST SURGERY  April and July 2021    Allergies[1]  Outpatient Encounter Medications as of 05/11/2024  Medication Sig   ALPRAZolam   (XANAX ) 0.25 MG tablet Take 1 tablet (0.25 mg total) by mouth 2 (two) times daily as needed for anxiety.   Cholecalciferol (VITAMIN D ) 50 MCG (2000 UT) tablet Take 1 tablet (2,000 Units total) by mouth daily.   escitalopram  (LEXAPRO ) 10 MG tablet Take 1 tablet (10 mg total) by mouth daily.   escitalopram  (LEXAPRO ) 5 MG tablet Take 1 tablet (5 mg total) by mouth daily.   traZODone  (DESYREL ) 50 MG tablet Take 0.5-1 tablets (25-50 mg total) by mouth at bedtime as needed for sleep.   Facility-Administered Encounter Medications as of 05/11/2024  Medication   gemcitabine  (GEMZAR ) chemo syringe for bladder instillation 2,000 mg    Review of Systems:  Review of Systems  Constitutional:  Negative for activity change, appetite change and unexpected weight change.  HENT: Negative.    Respiratory:  Negative for cough and shortness of breath.   Cardiovascular:  Negative for leg swelling.  Gastrointestinal:  Negative for constipation.  Genitourinary:  Positive for frequency.  Musculoskeletal:  Negative for arthralgias, gait problem and myalgias.  Skin: Negative.  Negative for rash.  Neurological:  Negative for dizziness and weakness.  Psychiatric/Behavioral:  Positive for dysphoric mood and sleep disturbance. Negative for confusion. The patient is nervous/anxious.   All other systems reviewed and are negative.   Health Maintenance  Topic Date  Due   COVID-19 Vaccine (7 - Moderna risk 2025-26 season) 06/29/2024   Medicare Annual Wellness (AWV)  01/13/2025   DTaP/Tdap/Td (4 - Td or Tdap) 08/08/2029   Pneumococcal Vaccine: 50+ Years  Completed   Influenza Vaccine  Completed   Zoster Vaccines- Shingrix  Completed   Meningococcal B Vaccine  Aged Out    Physical Exam: Vitals:   05/11/24 1420  BP: 128/78  Pulse: 73  Resp: 18  Temp: (!) 97.4 F (36.3 C)  SpO2: 98%  Weight: 183 lb 1.6 oz (83.1 kg)  Height: 6' (1.829 m)   Body mass index is 24.83 kg/m. Physical Exam Vitals reviewed.   Constitutional:      Appearance: Normal appearance.  HENT:     Head: Normocephalic.  Neurological:     General: No focal deficit present.     Mental Status: He is alert and oriented to person, place, and time.  Psychiatric:        Mood and Affect: Mood normal.        Thought Content: Thought content normal.     Labs reviewed: Basic Metabolic Panel: Recent Labs    06/29/23 0754 12/28/23 0748 04/21/24 0741  NA 135 141 135  K 4.0 4.0 4.2  CL 105 109 105  CO2 22 22 23   GLUCOSE 137* 88 104  BUN 19 17 16   CREATININE 0.90 0.89 0.83  CALCIUM 8.8 8.9 8.7  TSH 1.52 2.32 1.43   Liver Function Tests: Recent Labs    06/29/23 0754 12/28/23 0748 04/21/24 0741  AST 17 21 17   ALT 12 18 14   BILITOT 1.0 0.9 0.9  PROT 6.7 6.6 6.5   No results for input(s): LIPASE, AMYLASE in the last 8760 hours. No results for input(s): AMMONIA in the last 8760 hours. CBC: Recent Labs    06/29/23 0754 12/28/23 0748 04/21/24 0741  WBC 5.8 6.8 6.7  NEUTROABS 3,109 3,584 4,241  HGB 14.1 14.6 13.9  HCT 42.1 43.8 41.7  MCV 102.7* 101.6* 100.2  PLT 232 232 291   Lipid Panel: Recent Labs    06/29/23 0754 12/28/23 0748  CHOL 167 197  HDL 52 59  LDLCALC 97 118*  TRIG 88 94  CHOLHDL 3.2 3.3   Lab Results  Component Value Date   HGBA1C 5.3 12/28/2023    Procedures since last visit: No results found.  Assessment/Plan 1. Acute depression (Primary) Stable on Lexapro , Xanax  and Now Trazadone Will Follow with Psych   2. Chronic rhinitis Better with Flonase  3. Vitamin B 12 deficiency Supplement  4. Vitamin D  deficiency Supplement    Labs/tests ordered:  * No order type specified * Next appt:  Visit date not found        [1]  Allergies Allergen Reactions   Prednisone Anxiety   "

## 2024-05-18 ENCOUNTER — Encounter: Admitting: Internal Medicine

## 2024-06-29 ENCOUNTER — Encounter: Payer: Self-pay | Admitting: Internal Medicine

## 2024-11-09 ENCOUNTER — Encounter: Admitting: Internal Medicine
